# Patient Record
Sex: Male | Born: 1935 | Race: White | Hispanic: No | Marital: Married | Smoking: Never smoker
Health system: Southern US, Community
[De-identification: ages and names within clinical notes are randomized; demographics above are authoritative.]

## PROBLEM LIST (undated history)

## (undated) DIAGNOSIS — E7800 Pure hypercholesterolemia, unspecified: Secondary | ICD-10-CM

## (undated) DIAGNOSIS — R001 Bradycardia, unspecified: Secondary | ICD-10-CM

## (undated) DIAGNOSIS — H9190 Unspecified hearing loss, unspecified ear: Secondary | ICD-10-CM

## (undated) DIAGNOSIS — R2681 Unsteadiness on feet: Secondary | ICD-10-CM

## (undated) DIAGNOSIS — G2581 Restless legs syndrome: Secondary | ICD-10-CM

## (undated) DIAGNOSIS — Z6826 Body mass index (BMI) 26.0-26.9, adult: Secondary | ICD-10-CM

## (undated) DIAGNOSIS — M7989 Other specified soft tissue disorders: Secondary | ICD-10-CM

## (undated) DIAGNOSIS — I10 Essential (primary) hypertension: Secondary | ICD-10-CM

## (undated) HISTORY — DX: Pure hypercholesterolemia, unspecified: E78.00

## (undated) HISTORY — DX: Unspecified hearing loss, unspecified ear: H91.90

## (undated) HISTORY — DX: Bradycardia, unspecified: R00.1

## (undated) HISTORY — DX: Essential (primary) hypertension: I10

## (undated) HISTORY — PX: PACEMAKER IMPLANT: EP1218

## (undated) HISTORY — DX: Restless legs syndrome: G25.81

## (undated) HISTORY — DX: Body mass index (BMI) 26.0-26.9, adult: Z68.26

## (undated) HISTORY — DX: Unsteadiness on feet: R26.81

## (undated) HISTORY — DX: Other specified soft tissue disorders: M79.89

---

## 1998-09-03 HISTORY — PX: TOTAL HIP ARTHROPLASTY: SHX124

## 2019-04-20 ENCOUNTER — Other Ambulatory Visit: Payer: Self-pay

## 2019-04-20 ENCOUNTER — Telehealth (INDEPENDENT_AMBULATORY_CARE_PROVIDER_SITE_OTHER): Payer: Medicare Other | Admitting: Internal Medicine

## 2019-04-20 ENCOUNTER — Telehealth: Payer: Self-pay

## 2019-04-20 ENCOUNTER — Encounter: Payer: Self-pay | Admitting: Internal Medicine

## 2019-04-20 VITALS — BP 159/68 | HR 88

## 2019-04-20 DIAGNOSIS — I10 Essential (primary) hypertension: Secondary | ICD-10-CM | POA: Diagnosis not present

## 2019-04-20 DIAGNOSIS — R001 Bradycardia, unspecified: Secondary | ICD-10-CM | POA: Diagnosis not present

## 2019-04-20 NOTE — Telephone Encounter (Signed)
Spoke with pt regarding appt on 04/20/19. Pt stated he would rather do a phonecall. Pt was advise to check vitals prior to appt. Pt questions were address.

## 2019-04-20 NOTE — Telephone Encounter (Signed)
I spoke with Lattie Haw from [place] and she is going to fax me a copy of his Medtronic card. Once I receive the copy of the Medtronic card I can get the pt serial number for his device. I need that serial number to get the pt transferred into our clinic and order him a transmitter.

## 2019-04-20 NOTE — Progress Notes (Signed)
Electrophysiology TeleHealth Note   Due to national recommendations of social distancing due to [place] 19, an audio telehealth visit is felt to be most appropriate for this patient at this time.  Verbal consent was obtained by me for the telehealth visit today.  The patient does not have capability for a virtual visit.  A phone visit is therefore required today.   Date:  04/20/2019   ID:  Tom Chavez, DOB 08-27-1919, MRN [id number]  Location: patient's independent living facility  Provider location:  [facility]  Evaluation Performed: Follow-up visit  PCP:  Monico Blitz, MD   Electrophysiologist:  Dr Rayann Heman  Chief Complaint:  bradycardia  History of Present Illness:    Tom Chavez is a 85 y.o. male who presents via telehealth conferencing today.  This is a new patient visit.  He is referred by Dr Manuella Ghazi for pacemaker management.  He had PPM implanted for symptomatic bradycardia in [place] by Dr Violet Baldy in 2014.  He has done very well since.  He remains active for his age and is in an independent living facility. Today, he denies symptoms of palpitations, chest pain, shortness of breath,  lower extremity edema, dizziness, presyncope, or syncope.  The patient is otherwise without complaint today.    Past Medical History:  Diagnosis Date  . Body mass index 26.0-26.9, adult   . Decreased hearing   . Hypercholesterolemia   . Hypertension   . Restless leg   . Swelling of both lower extremities   . Symptomatic bradycardia    implanted at [facility])  . Unsteady gait     Past Surgical History:  Procedure Laterality Date  . PACEMAKER IMPLANT     Medtronic PPM implanted by Dr Violet Baldy at [facility], [place]  . TOTAL HIP ARTHROPLASTY Right 2000    Current Outpatient Medications  Medication Sig Dispense Refill  . AMLODIPINE BESYLATE PO Take 10 mg by mouth daily.    Marland Kitchen atorvastatin (LIPITOR) 20 MG tablet Take 20 mg by mouth daily.    Marland Kitchen rOPINIRole (REQUIP) 0.25 MG  tablet Take 0.25 mg by mouth at bedtime.    . valsartan-hydrochlorothiazide (DIOVAN-HCT) 160-25 MG tablet Take 1 tablet by mouth daily.     No current facility-administered medications for this visit.     Allergies:   Patient has no known allergies.   Social History:  The patient  reports that he has never smoked. He has never used smokeless tobacco. He reports that he does not drink alcohol or use drugs.   Family History: HTN  ROS:  Please see the history of present illness.   All other systems are personally reviewed and negative.    Exam:    Vital Signs:  BP (!) 159/68   Pulse 88   Well sounding   Labs/Other Tests and Data Reviewed:    Recent Labs: No results found for requested labs within last 8760 hours.   Wt Readings from Last 3 Encounters:  No data found for Wt      ASSESSMENT & PLAN:    1.  Symptomatic bradycardia I suspect complete heart block by history.  No cardiology office notes for review.  He does not typical follow remotely.  Device implanted 2014. I have sent a message to [place] to arrange for a remote monitoring (MDT) transmitter to be sent to his independent living facility).  The director of the facility has agreed to assist him with remotes. Follow-up in office in 3 months  if COVID 19 allows  2. HTN Stable No change required today   Follow-up:  As above   Patient Risk:  after full review of this patients clinical status, I feel that they are at moderate risk at this time.  Today, I have spent 15 minutes with the patient with telehealth technology discussing arrhythmia management .    Randolm IdolSigned, Iyah Laguna, MD  04/20/2019 12:02 PM     [facility] [address] [place] [address] [phone] (office) [phone] (fax)

## 2019-04-21 NOTE — Telephone Encounter (Signed)
I spoke with Judeen Hammans from [place] and let her know that the pt monitor is ordered. I let her know I took care of putting the pt information in our system. I told her if she needs help when the pt monitor comes to let me know. I gave her my direct office number.

## 2019-05-13 NOTE — Telephone Encounter (Signed)
Follow up     Judeen Hammans is calling with assistance on sitting up the monitor and how to do the transmission.

## 2019-05-14 NOTE — Telephone Encounter (Signed)
Called back to [place] at [facility] and she has left for the day and was asked to call her back 05/15/19 after 10 am.

## 2019-05-19 NOTE — Telephone Encounter (Signed)
I left a message with Sherri receptionist for her to give me a call back.

## 2019-05-21 ENCOUNTER — Telehealth: Payer: Self-pay

## 2019-05-21 NOTE — Telephone Encounter (Signed)
I spoke with Judeen Hammans from [place] and she agreed to send a transmission with the pt monitor on Monday.

## 2019-05-26 ENCOUNTER — Ambulatory Visit (INDEPENDENT_AMBULATORY_CARE_PROVIDER_SITE_OTHER): Payer: Medicare Other | Admitting: *Deleted

## 2019-05-26 DIAGNOSIS — R001 Bradycardia, unspecified: Secondary | ICD-10-CM | POA: Diagnosis not present

## 2019-05-27 LAB — CUP PACEART REMOTE DEVICE CHECK
Battery Impedance: 1210 Ohm
Battery Remaining Longevity: 49 mo
Battery Voltage: 2.78 V
Brady Statistic AP VP Percent: 12 %
Brady Statistic AP VS Percent: 0 %
Brady Statistic AS VP Percent: 88 %
Brady Statistic AS VS Percent: 0 %
Date Time Interrogation Session: 20200922150659
Implantable Lead Implant Date: 20140821
Implantable Lead Implant Date: 20140821
Implantable Lead Location: 753862
Implantable Lead Location: 753862
Implantable Lead Model: 5076
Implantable Lead Model: 5076
Implantable Pulse Generator Implant Date: 20140821
Lead Channel Impedance Value: 394 Ohm
Lead Channel Impedance Value: 418 Ohm
Lead Channel Pacing Threshold Amplitude: 0.75 V
Lead Channel Pacing Threshold Amplitude: 0.875 V
Lead Channel Pacing Threshold Pulse Width: 0.4 ms
Lead Channel Pacing Threshold Pulse Width: 0.4 ms
Lead Channel Setting Pacing Amplitude: 1.5 V
Lead Channel Setting Pacing Amplitude: 2 V
Lead Channel Setting Pacing Pulse Width: 0.4 ms
Lead Channel Setting Sensing Sensitivity: 2 mV

## 2019-06-08 NOTE — Progress Notes (Signed)
Remote pacemaker transmission.   

## 2019-07-29 ENCOUNTER — Telehealth (INDEPENDENT_AMBULATORY_CARE_PROVIDER_SITE_OTHER): Payer: Medicare Other | Admitting: Internal Medicine

## 2019-07-29 ENCOUNTER — Encounter: Payer: Self-pay | Admitting: Internal Medicine

## 2019-07-29 VITALS — BP 163/73 | HR 101 | Temp 98.6°F

## 2019-07-29 DIAGNOSIS — R001 Bradycardia, unspecified: Secondary | ICD-10-CM

## 2019-07-29 DIAGNOSIS — I10 Essential (primary) hypertension: Secondary | ICD-10-CM

## 2019-07-29 NOTE — Progress Notes (Signed)
Electrophysiology TeleHealth Note   Due to national recommendations of social distancing due to [place] 19, an audio telehealth visit is felt to be most appropriate for this patient at this time.  Verbal consent was obtained by me for the telehealth visit today.  The patient does not have capability for a virtual visit.  A phone visit is therefore required today.   Date:  07/29/2019   ID:  Tom Chavez, DOB [birth year], MRN [id number]  Location: patient's home  Provider location:  [facility]  Evaluation Performed: Follow-up visit  PCP:  Monico Blitz, MD   Electrophysiologist:  Dr Rayann Heman  Chief Complaint:  Pacemaker follow up  History of Present Illness:    Tom Chavez is a 85 y.o. male who presents via telehealth conferencing today.  Since last being seen in our clinic, the patient reports doing very well.  Today, he denies symptoms of palpitations, chest pain, shortness of breath,  lower extremity edema, dizziness, presyncope, or syncope.  The patient is otherwise without complaint today.  The patient denies symptoms of fevers, chills, cough, or new SOB worrisome for COVID 19.  Past Medical History:  Diagnosis Date  . Body mass index 26.0-26.9, adult   . Decreased hearing   . Hypercholesterolemia   . Hypertension   . Restless leg   . Swelling of both lower extremities   . Symptomatic bradycardia    implanted at [facility])  . Unsteady gait     Past Surgical History:  Procedure Laterality Date  . PACEMAKER IMPLANT     Medtronic PPM implanted by Dr Hinton Dyer at [facility], [place]  . TOTAL HIP ARTHROPLASTY Right 2000    Current Outpatient Medications  Medication Sig Dispense Refill  . AMLODIPINE BESYLATE PO Take 10 mg by mouth daily.    Marland Kitchen atorvastatin (LIPITOR) 20 MG tablet Take 20 mg by mouth daily.    Marland Kitchen rOPINIRole (REQUIP) 0.25 MG tablet Take 0.25 mg by mouth at bedtime.    . valsartan-hydrochlorothiazide (DIOVAN-HCT) 160-25 MG tablet Take 1  tablet by mouth daily.     No current facility-administered medications for this visit.     Allergies:   Patient has no known allergies.   Social History:  The patient  reports that he has never smoked. He has never used smokeless tobacco. He reports that he does not drink alcohol or use drugs.   Family History:  The patient's has no premature CAD   ROS:  Please see the history of present illness.   All other systems are personally reviewed and negative.    Exam:    Vital Signs:  BP (!) 163/73   Pulse (!) 101   Temp 98.6 F (37 C)   Well sounding, alert and conversant, regular work of breathing  Labs/Other Tests and Data Reviewed:    Recent Labs: No results found for requested labs within last 8760 hours.   Wt Readings from Last 3 Encounters:  No data found for Wt     Last device remote is reviewed from [place] PDF which reveals normal device function   ASSESSMENT & PLAN:    1.  Symptomatic bradycardia Normal device function by recent remote See Pace[employer] report Continue [facility] transmissions  2.  HTN Elevated today but due to "hurrying to get to the visit" Normally controlled   Follow-up:  [facility], me in 1 year in [place]    Patient Risk:  after full review of this patients clinical status, I feel that they  are at moderate risk at this time.  Today, I have spent 15 minutes with the patient with telehealth technology discussing arrhythmia management .    Randolm Idol, MD  07/29/2019 11:57 AM     [facility] [address] [place] [address] [phone] (office) [phone] (fax)

## 2019-08-16 ENCOUNTER — Emergency Department (HOSPITAL_COMMUNITY): Payer: Medicare Other

## 2019-08-16 ENCOUNTER — Other Ambulatory Visit: Payer: Self-pay

## 2019-08-16 ENCOUNTER — Inpatient Hospital Stay (HOSPITAL_COMMUNITY)
Admission: EM | Admit: 2019-08-16 | Discharge: 2019-08-22 | DRG: 177 | Disposition: A | Discharge status: Skilled Nursing Facility | Payer: Medicare Other | Attending: Internal Medicine | Admitting: Internal Medicine

## 2019-08-16 ENCOUNTER — Encounter (HOSPITAL_COMMUNITY): Payer: Self-pay | Admitting: Emergency Medicine

## 2019-08-16 DIAGNOSIS — E861 Hypovolemia: Secondary | ICD-10-CM | POA: Diagnosis present

## 2019-08-16 DIAGNOSIS — R609 Edema, unspecified: Secondary | ICD-10-CM | POA: Diagnosis not present

## 2019-08-16 DIAGNOSIS — I447 Left bundle-branch block, unspecified: Secondary | ICD-10-CM | POA: Diagnosis present

## 2019-08-16 DIAGNOSIS — E7800 Pure hypercholesterolemia, unspecified: Secondary | ICD-10-CM | POA: Diagnosis present

## 2019-08-16 DIAGNOSIS — R0602 Shortness of breath: Secondary | ICD-10-CM

## 2019-08-16 DIAGNOSIS — E871 Hypo-osmolality and hyponatremia: Secondary | ICD-10-CM | POA: Diagnosis present

## 2019-08-16 DIAGNOSIS — N179 Acute kidney failure, unspecified: Secondary | ICD-10-CM | POA: Diagnosis present

## 2019-08-16 DIAGNOSIS — E785 Hyperlipidemia, unspecified: Secondary | ICD-10-CM | POA: Diagnosis present

## 2019-08-16 DIAGNOSIS — N1832 Chronic kidney disease, stage 3b: Secondary | ICD-10-CM | POA: Diagnosis present

## 2019-08-16 DIAGNOSIS — N183 Chronic kidney disease, stage 3 unspecified: Secondary | ICD-10-CM

## 2019-08-16 DIAGNOSIS — I129 Hypertensive chronic kidney disease with stage 1 through stage 4 chronic kidney disease, or unspecified chronic kidney disease: Secondary | ICD-10-CM | POA: Diagnosis present

## 2019-08-16 DIAGNOSIS — I10 Essential (primary) hypertension: Secondary | ICD-10-CM

## 2019-08-16 DIAGNOSIS — J1289 Other viral pneumonia: Secondary | ICD-10-CM | POA: Diagnosis present

## 2019-08-16 DIAGNOSIS — J9601 Acute respiratory failure with hypoxia: Secondary | ICD-10-CM | POA: Diagnosis present

## 2019-08-16 DIAGNOSIS — M79605 Pain in left leg: Secondary | ICD-10-CM | POA: Diagnosis not present

## 2019-08-16 DIAGNOSIS — U071 COVID-19: Principal | ICD-10-CM | POA: Diagnosis present

## 2019-08-16 DIAGNOSIS — Z96641 Presence of right artificial hip joint: Secondary | ICD-10-CM | POA: Diagnosis present

## 2019-08-16 DIAGNOSIS — E860 Dehydration: Secondary | ICD-10-CM | POA: Diagnosis present

## 2019-08-16 DIAGNOSIS — R0902 Hypoxemia: Secondary | ICD-10-CM

## 2019-08-16 DIAGNOSIS — J1282 Pneumonia due to coronavirus disease 2019: Secondary | ICD-10-CM | POA: Diagnosis present

## 2019-08-16 DIAGNOSIS — Z79899 Other long term (current) drug therapy: Secondary | ICD-10-CM

## 2019-08-16 DIAGNOSIS — M79604 Pain in right leg: Secondary | ICD-10-CM | POA: Diagnosis not present

## 2019-08-16 DIAGNOSIS — D631 Anemia in chronic kidney disease: Secondary | ICD-10-CM | POA: Diagnosis present

## 2019-08-16 DIAGNOSIS — I159 Secondary hypertension, unspecified: Secondary | ICD-10-CM | POA: Diagnosis not present

## 2019-08-16 DIAGNOSIS — G2581 Restless legs syndrome: Secondary | ICD-10-CM | POA: Diagnosis present

## 2019-08-16 DIAGNOSIS — H9190 Unspecified hearing loss, unspecified ear: Secondary | ICD-10-CM | POA: Diagnosis present

## 2019-08-16 LAB — CBC WITH DIFFERENTIAL/PLATELET
Abs Immature Granulocytes: 0.01 10*3/uL (ref 0.00–0.07)
Basophils Absolute: 0 10*3/uL (ref 0.0–0.1)
Basophils Relative: 0 %
Eosinophils Absolute: 0 10*3/uL (ref 0.0–0.5)
Eosinophils Relative: 0 %
HCT: 35.3 % — ABNORMAL LOW (ref 39.0–52.0)
Hemoglobin: 11.2 g/dL — ABNORMAL LOW (ref 13.0–17.0)
Immature Granulocytes: 0 %
Lymphocytes Relative: 9 %
Lymphs Abs: 0.5 10*3/uL — ABNORMAL LOW (ref 0.7–4.0)
MCH: 28.9 pg (ref 26.0–34.0)
MCHC: 31.7 g/dL (ref 30.0–36.0)
MCV: 91.2 fL (ref 80.0–100.0)
Monocytes Absolute: 0.7 10*3/uL (ref 0.1–1.0)
Monocytes Relative: 15 %
Neutro Abs: 3.6 10*3/uL (ref 1.7–7.7)
Neutrophils Relative %: 76 %
Platelets: 173 10*3/uL (ref 150–400)
RBC: 3.87 MIL/uL — ABNORMAL LOW (ref 4.22–5.81)
RDW: 14.6 % (ref 11.5–15.5)
WBC: 4.8 10*3/uL (ref 4.0–10.5)
nRBC: 0 % (ref 0.0–0.2)

## 2019-08-16 LAB — COMPREHENSIVE METABOLIC PANEL
ALT: 18 U/L (ref 0–44)
AST: 22 U/L (ref 15–41)
Albumin: 3.9 g/dL (ref 3.5–5.0)
Alkaline Phosphatase: 84 U/L (ref 38–126)
Anion gap: 13 (ref 5–15)
BUN: 28 mg/dL — ABNORMAL HIGH (ref 8–23)
CO2: 21 mmol/L — ABNORMAL LOW (ref 22–32)
Calcium: 9 mg/dL (ref 8.9–10.3)
Chloride: 94 mmol/L — ABNORMAL LOW (ref 98–111)
Creatinine, Ser: 1.39 mg/dL — ABNORMAL HIGH (ref 0.61–1.24)
GFR calc Af Amer: 49 mL/min — ABNORMAL LOW (ref >60)
GFR calc non Af Amer: 42 mL/min — ABNORMAL LOW (ref >60)
Glucose, Bld: 103 mg/dL — ABNORMAL HIGH (ref 70–99)
Potassium: 4 mmol/L (ref 3.5–5.1)
Sodium: 128 mmol/L — ABNORMAL LOW (ref 135–145)
Total Bilirubin: 0.7 mg/dL (ref 0.3–1.2)
Total Protein: 6.7 g/dL (ref 6.5–8.1)

## 2019-08-16 LAB — TRIGLYCERIDES: Triglycerides: 57 mg/dL (ref <150)

## 2019-08-16 LAB — POC SARS CORONAVIRUS 2 AG -  ED: SARS Coronavirus 2 Ag: POSITIVE — AB

## 2019-08-16 LAB — LACTATE DEHYDROGENASE: LDH: 207 U/L — ABNORMAL HIGH (ref 98–192)

## 2019-08-16 LAB — PROCALCITONIN: Procalcitonin: <0.1 ng/mL

## 2019-08-16 LAB — FIBRINOGEN: Fibrinogen: 413 mg/dL (ref 210–475)

## 2019-08-16 LAB — C-REACTIVE PROTEIN: CRP: 5.1 mg/dL — ABNORMAL HIGH (ref <1.0)

## 2019-08-16 LAB — FERRITIN: Ferritin: 349 ng/mL — ABNORMAL HIGH (ref 24–336)

## 2019-08-16 LAB — D-DIMER, QUANTITATIVE: D-Dimer, Quant: 1.9 ug/mL-FEU — ABNORMAL HIGH (ref 0.00–0.50)

## 2019-08-16 LAB — LACTIC ACID, PLASMA
Lactic Acid, Venous: 1.3 mmol/L (ref 0.5–1.9)
Lactic Acid, Venous: 1.1 mmol/L (ref 0.5–1.9)

## 2019-08-16 MED ORDER — ACETAMINOPHEN 500 MG PO TABS
1000.0000 mg | ORAL_TABLET | Freq: Once | ORAL | Status: AC
  Administered 2019-08-16: 1000 mg via ORAL
  Filled 2019-08-16: qty 2

## 2019-08-16 NOTE — ED Provider Notes (Signed)
Emergency Department Provider Note   I have reviewed the triage vital signs and the nursing notes.   HISTORY  Chief Complaint Shortness of Breath   HPI Stpehen Chavez is a 85 y.o. male with past medical history reviewed below presents to the ED from [facility] with cough, congestion, sore throat, runny nose.  Staff report noticing some shortness of breath but patient denies feeling subjectively short of breath.  He denies chest or abdominal pain.  He did have 2 episodes of diarrhea today but denies vomiting.  No radiation of symptoms or other modifying factors. No known COVID contacts.  Past Medical History:  Diagnosis Date  . Body mass index 26.0-26.9, adult   . Decreased hearing   . Hypercholesterolemia   . Hypertension   . Restless leg   . Swelling of both lower extremities   . Symptomatic bradycardia    implanted at [facility])  . Unsteady gait     There are no problems to display for this patient.   Past Surgical History:  Procedure Laterality Date  . PACEMAKER IMPLANT     Medtronic PPM implanted by Dr Judd Gaudier at [facility], [place]  . TOTAL HIP ARTHROPLASTY Right 2000    Allergies Patient has no known allergies.  History reviewed. No pertinent family history.  Social History Social History   Tobacco Use  . Smoking status: Never Smoker  . Smokeless tobacco: Never Used  Substance Use Topics  . Alcohol use: Never  . Drug use: Never    Review of Systems  Constitutional: Positive fever/chills Eyes: No visual changes. ENT: Positive sore throat and nasal congestion.  Cardiovascular: Denies chest pain. Respiratory: Denies shortness of breath positive cough.  Gastrointestinal: No abdominal pain.  No nausea, no vomiting.  No diarrhea.  No constipation. Genitourinary: Negative for dysuria. Musculoskeletal: Negative for back pain. Skin: Negative for rash. Neurological: Negative for headaches, focal weakness or  numbness.  10-point ROS otherwise negative.  ____________________________________________   PHYSICAL EXAM:  VITAL SIGNS: ED Triage Vitals  Enc Vitals Group     BP 08/16/19 1720 (!) 141/58     Pulse Rate 08/16/19 1720 88     Resp 08/16/19 1720 (!) 33     Temp 08/16/19 1720 98.9 F (37.2 C)     Temp Source 08/16/19 1720 Oral     SpO2 08/16/19 1720 99 %     Weight 08/16/19 1722 145 lb (65.8 kg)     Height 08/16/19 1722 5\' 7"  (1.702 m)   Constitutional: Alert and oriented. Well appearing and in no acute distress. Eyes: Conjunctivae are normal. Head: Atraumatic. Nose: No congestion/rhinnorhea. Mouth/Throat: Mucous membranes are moist.   Neck: No stridor.   Cardiovascular: Normal rate, regular rhythm. Good peripheral circulation. Grossly normal heart sounds.   Respiratory: Increased respiratory effort.  No retractions. Lungs with crackles at the bases bilaterally.  Gastrointestinal: Soft and nontender. No distention.  Musculoskeletal: No gross deformities of extremities. Neurologic:  Normal speech and language Skin:  Skin is warm, dry and intact. No rash noted.   ____________________________________________   LABS (all labs ordered are listed, but only abnormal results are displayed)  Labs Reviewed  CBC WITH DIFFERENTIAL/PLATELET - Abnormal; Notable for the following components:      Result Value   RBC 3.87 (*)    Hemoglobin 11.2 (*)    HCT 35.3 (*)    Lymphs Abs 0.5 (*)    All other components within normal limits  COMPREHENSIVE METABOLIC PANEL -  Abnormal; Notable for the following components:   Sodium 128 (*)    Chloride 94 (*)    CO2 21 (*)    Glucose, Bld 103 (*)    BUN 28 (*)    Creatinine, Ser 1.39 (*)    GFR calc non Af Amer 42 (*)    GFR calc Af Amer 49 (*)    All other components within normal limits  D-DIMER, QUANTITATIVE (NOT AT [facility]) - Abnormal; Notable for the following components:   D-Dimer, Quant 1.90 (*)    All other components within normal  limits  LACTATE DEHYDROGENASE - Abnormal; Notable for the following components:   LDH 207 (*)    All other components within normal limits  FERRITIN - Abnormal; Notable for the following components:   Ferritin 349 (*)    All other components within normal limits  C-REACTIVE PROTEIN - Abnormal; Notable for the following components:   CRP 5.1 (*)    All other components within normal limits  POC SARS CORONAVIRUS 2 AG -  ED - Abnormal; Notable for the following components:   SARS Coronavirus 2 Ag POSITIVE (*)    All other components within normal limits  CULTURE, BLOOD (ROUTINE X 2)  CULTURE, BLOOD (ROUTINE X 2)  LACTIC ACID, PLASMA  LACTIC ACID, PLASMA  PROCALCITONIN  TRIGLYCERIDES  FIBRINOGEN   ____________________________________________  EKG   EKG Interpretation  Date/Time:  Sunday August 16 2019 17:16:39 EST Ventricular Rate:  84 PR Interval:    QRS Duration: 167 QT Interval:  444 QTC Calculation: 525 R Axis:   -44 Text Interpretation: Sinus rhythm Left bundle branch block Confirmed by Nanda Quinton [phone] on 08/16/2019 5:58:48 PM       ____________________________________________  RADIOLOGY  DG Chest Port 1 View  Result Date: 08/16/2019 CLINICAL DATA:  Shortness of breath for 2 days, COVID-19 positivity. EXAM: PORTABLE CHEST 1 VIEW COMPARISON:  None. FINDINGS: Cardiac shadow is enlarged. Pacing device is noted. Lungs are well aerated bilaterally. Patchy parenchymal opacities are noted bilaterally consistent with atypical pneumonia. No sizable effusion is seen. No bony abnormality is noted. IMPRESSION: Patchy opacities bilaterally consistent with atypical/viral pneumonia. This is consistent with the patient's given clinical history of COVID-19 positivity. Electronically Signed   By: Inez Catalina M.D.   On: 08/16/2019 18:01    ____________________________________________   PROCEDURES  Procedure(s) performed:   Procedures  CRITICAL CARE Performed by:  Margette Fast Total critical care time: 35 minutes Critical care time was exclusive of separately billable procedures and treating other patients. Critical care was necessary to treat or prevent imminent or life-threatening deterioration. Critical care was time spent personally by me on the following activities: development of treatment plan with patient and/or surrogate as well as nursing, discussions with consultants, evaluation of patient's response to treatment, examination of patient, obtaining history from patient or surrogate, ordering and performing treatments and interventions, ordering and review of laboratory studies, ordering and review of radiographic studies, pulse oximetry and re-evaluation of patient's condition.  Nanda Quinton, MD Emergency Medicine  ____________________________________________   INITIAL IMPRESSION / ASSESSMENT AND PLAN / ED COURSE  Pertinent labs & imaging results that were available during my care of the patient were reviewed by me and considered in my medical decision making (see chart for details).   Patient presents to the emergency department for evaluation of COVID-19-like illness.  His rapid test here in the emergency department was come back positive.  I called and spoke with his daughter who is  listed as the emergency contact.  She states that his wife actually would be more appropriate to discuss CODE STATUS and provided her phone number.  Will obtain screening lab work.  Patient does have tachypnea here but appears relatively comfortable.  Not requiring oxygen at rest but will ambulate with a walker which is his baseline.   Labs reviewed. CXR with infiltrate. Patient ambulated but had increased in WOB with RR > 40 and O2 sats of 87% on RA with standing only. Returns to normal sats if laying.   Discussed patient's case with [facility] to request admission. Patient and family (if present) updated with plan. [facility]  I reviewed all  nursing notes, vitals, pertinent old records, EKGs, labs, imaging (as available).  ____________________________________________  FINAL CLINICAL IMPRESSION(S) / ED DIAGNOSES  Final diagnoses:  Hypoxia  SOB (shortness of breath)  COVID-19    MEDICATIONS GIVEN DURING THIS VISIT:  Medications  acetaminophen (TYLENOL) tablet 1,000 mg (1,000 mg Oral Given 08/16/19 1831)    Note:  This document was prepared using Dragon voice recognition software and may include unintentional dictation errors.  Alona BeneJoshua Kris Burd, MD, [facility] Emergency Medicine    Liese Dizdarevic, Arlyss RepressJoshua G, MD 08/16/19 2201

## 2019-08-16 NOTE — ED Notes (Signed)
When pt stood on the side of the bed to urinate his O2 sats dropped to 87%. Therefore, I did not ambulate pt with risk of even more hypoxia. Pt was very SOB and had labored breathings, respirations increased to 44 bpm while standing at bedside even without ambulating. Pt placed back in bed with O2 sats increasing to 98% at rest.

## 2019-08-16 NOTE — ED Triage Notes (Signed)
Pt brought in from [facility] for cough, congestion, sore throat, and runny nose along with sob since Friday.

## 2019-08-17 DIAGNOSIS — J1289 Other viral pneumonia: Secondary | ICD-10-CM

## 2019-08-17 DIAGNOSIS — U071 COVID-19: Principal | ICD-10-CM

## 2019-08-17 LAB — CBC WITH DIFFERENTIAL/PLATELET
Abs Immature Granulocytes: 0.02 10*3/uL (ref 0.00–0.07)
Basophils Absolute: 0 10*3/uL (ref 0.0–0.1)
Basophils Relative: 0 %
Eosinophils Absolute: 0 10*3/uL (ref 0.0–0.5)
Eosinophils Relative: 0 %
HCT: 36.4 % — ABNORMAL LOW (ref 39.0–52.0)
Hemoglobin: 11.5 g/dL — ABNORMAL LOW (ref 13.0–17.0)
Immature Granulocytes: 0 %
Lymphocytes Relative: 4 %
Lymphs Abs: 0.3 10*3/uL — ABNORMAL LOW (ref 0.7–4.0)
MCH: 29 pg (ref 26.0–34.0)
MCHC: 31.6 g/dL (ref 30.0–36.0)
MCV: 91.7 fL (ref 80.0–100.0)
Monocytes Absolute: 0.6 10*3/uL (ref 0.1–1.0)
Monocytes Relative: 9 %
Neutro Abs: 5.3 10*3/uL (ref 1.7–7.7)
Neutrophils Relative %: 87 %
Platelets: 171 10*3/uL (ref 150–400)
RBC: 3.97 MIL/uL — ABNORMAL LOW (ref 4.22–5.81)
RDW: 14.5 % (ref 11.5–15.5)
WBC: 6.1 10*3/uL (ref 4.0–10.5)
nRBC: 0 % (ref 0.0–0.2)

## 2019-08-17 LAB — COMPREHENSIVE METABOLIC PANEL
ALT: 20 U/L (ref 0–44)
AST: 26 U/L (ref 15–41)
Albumin: 3.7 g/dL (ref 3.5–5.0)
Alkaline Phosphatase: 74 U/L (ref 38–126)
Anion gap: 14 (ref 5–15)
BUN: 31 mg/dL — ABNORMAL HIGH (ref 8–23)
CO2: 18 mmol/L — ABNORMAL LOW (ref 22–32)
Calcium: 8.8 mg/dL — ABNORMAL LOW (ref 8.9–10.3)
Chloride: 99 mmol/L (ref 98–111)
Creatinine, Ser: 1.49 mg/dL — ABNORMAL HIGH (ref 0.61–1.24)
GFR calc Af Amer: 45 mL/min — ABNORMAL LOW (ref >60)
GFR calc non Af Amer: 39 mL/min — ABNORMAL LOW (ref >60)
Glucose, Bld: 112 mg/dL — ABNORMAL HIGH (ref 70–99)
Potassium: 4.1 mmol/L (ref 3.5–5.1)
Sodium: 131 mmol/L — ABNORMAL LOW (ref 135–145)
Total Bilirubin: 0.9 mg/dL (ref 0.3–1.2)
Total Protein: 6.5 g/dL (ref 6.5–8.1)

## 2019-08-17 LAB — ABO/RH: ABO/RH(D): A POS

## 2019-08-17 LAB — D-DIMER, QUANTITATIVE: D-Dimer, Quant: 1.97 ug/mL-FEU — ABNORMAL HIGH (ref 0.00–0.50)

## 2019-08-17 LAB — C-REACTIVE PROTEIN: CRP: 8.5 mg/dL — ABNORMAL HIGH (ref <1.0)

## 2019-08-17 LAB — FERRITIN: Ferritin: 427 ng/mL — ABNORMAL HIGH (ref 24–336)

## 2019-08-17 MED ORDER — IRBESARTAN 150 MG PO TABS
150.0000 mg | ORAL_TABLET | Freq: Every day | ORAL | Status: DC
  Administered 2019-08-17 – 2019-08-22 (×5): 150 mg via ORAL
  Filled 2019-08-17 (×8): qty 1

## 2019-08-17 MED ORDER — SODIUM CHLORIDE 0.9 % IV SOLN
200.0000 mg | Freq: Once | INTRAVENOUS | Status: DC
  Administered 2019-08-17: 200 mg via INTRAVENOUS
  Filled 2019-08-17: qty 40

## 2019-08-17 MED ORDER — AMLODIPINE BESYLATE 10 MG PO TABS
10.0000 mg | ORAL_TABLET | Freq: Every day | ORAL | Status: DC
  Administered 2019-08-17 – 2019-08-22 (×6): 10 mg via ORAL
  Filled 2019-08-17: qty 1
  Filled 2019-08-17: qty 2
  Filled 2019-08-17 (×4): qty 1

## 2019-08-17 MED ORDER — ROPINIROLE HCL 0.25 MG PO TABS
0.2500 mg | ORAL_TABLET | Freq: Every day | ORAL | Status: DC
  Administered 2019-08-18 – 2019-08-21 (×4): 0.25 mg via ORAL
  Filled 2019-08-17 (×8): qty 1

## 2019-08-17 MED ORDER — SODIUM CHLORIDE 0.9% FLUSH
3.0000 mL | Freq: Two times a day (BID) | INTRAVENOUS | Status: DC
  Administered 2019-08-17 – 2019-08-18 (×2): 3 mL via INTRAVENOUS
  Administered 2019-08-19: 10 mL via INTRAVENOUS
  Administered 2019-08-19 – 2019-08-22 (×7): 3 mL via INTRAVENOUS

## 2019-08-17 MED ORDER — ENOXAPARIN SODIUM 30 MG/0.3ML ~~LOC~~ SOLN
30.0000 mg | SUBCUTANEOUS | Status: DC
  Administered 2019-08-17 – 2019-08-18 (×2): 30 mg via SUBCUTANEOUS
  Filled 2019-08-17 (×2): qty 0.3

## 2019-08-17 MED ORDER — ATORVASTATIN CALCIUM 10 MG PO TABS
20.0000 mg | ORAL_TABLET | Freq: Every day | ORAL | Status: DC
  Administered 2019-08-17 – 2019-08-22 (×6): 20 mg via ORAL
  Filled 2019-08-17 (×6): qty 2

## 2019-08-17 MED ORDER — DEXAMETHASONE SODIUM PHOSPHATE 10 MG/ML IJ SOLN
6.0000 mg | INTRAMUSCULAR | Status: DC
  Administered 2019-08-17 – 2019-08-22 (×6): 6 mg via INTRAVENOUS
  Filled 2019-08-17 (×6): qty 1

## 2019-08-17 MED ORDER — SODIUM CHLORIDE 0.9 % IV SOLN
250.0000 mL | INTRAVENOUS | Status: DC | PRN
  Administered 2019-08-22: 250 mL via INTRAVENOUS

## 2019-08-17 MED ORDER — SODIUM CHLORIDE 0.9 % IV SOLN: 200.0000 mg | Freq: Once | INTRAVENOUS | Status: DC

## 2019-08-17 MED ORDER — SODIUM CHLORIDE 0.9 % IV SOLN
INTRAVENOUS | Status: AC
  Administered 2019-08-17: 04:00:00 via INTRAVENOUS

## 2019-08-17 MED ORDER — SODIUM CHLORIDE 0.9 % IV SOLN: 100.0000 mg | Freq: Every day | INTRAVENOUS | Status: DC

## 2019-08-17 MED ORDER — SODIUM CHLORIDE 0.9% FLUSH: 3.0000 mL | INTRAVENOUS | Status: DC | PRN

## 2019-08-17 MED ORDER — SODIUM CHLORIDE 0.9 % IV SOLN
100.0000 mg | Freq: Every day | INTRAVENOUS | Status: DC
  Administered 2019-08-18 – 2019-08-20 (×2): 100 mg via INTRAVENOUS
  Filled 2019-08-17 (×4): qty 20

## 2019-08-17 NOTE — Plan of Care (Signed)
  Problem: Education: Goal: Knowledge of risk factors and measures for prevention of condition will improve Outcome: Progressing   Problem: Coping: Goal: Psychosocial and spiritual needs will be supported Outcome: Progressing   Problem: Respiratory: Goal: Will maintain a patent airway Outcome: Progressing Goal: Complications related to the disease process, condition or treatment will be avoided or minimized Outcome: Progressing   

## 2019-08-17 NOTE — Progress Notes (Addendum)
Notified daughter of progress all questions answered and this nurse's number shared for further communication.  

## 2019-08-17 NOTE — Progress Notes (Signed)
Patient seen and examined. Admitted after midnight secondary to cough, SOB, runny nose, diarrhea and anorexia. Symptoms present for 3 days or so and worsening. CXR with bilateral patchy infiltrates, COVID-19 test positive. Stable oxygen saturation and VS. Patient will be admitted for steroids remdesivir and supportive care. Please refer to H&P written by Dr. Darrick Meigs for further info/details on admission.   Plan: -gentle IVf's for dehydration, AKI and hyponatremia -steroids and remdesivir as per COVID protocol  -supportive care and PRN bronchodilators  -follow clinical response.  Barton Dubois MD [phone]

## 2019-08-17 NOTE — H&P (Signed)
TRH H&P    Patient Demographics:    Tom Chavez, is a 85 y.o. male  MRN: [id number]  DOB - Feb 05, 1919  Admit Date - 08/16/2019  Referring MD/NP/PA: Nanda Quinton  Outpatient Primary MD for the patient is Monico Blitz, MD  Patient coming from: [facility]  Chief complaint-shortness of breath   HPI:    Tom Chavez  is a 85 y.o. male, with history of hypertension, hyperlipidemia, was brought to the ED from [facility] with chief complaints of cough, congestion sore throat, runny nose.  Patient has a hearing aid and history is limited.  History obtained from the ED records.  Patient denies shortness of breath.  Denies chest or abdominal pain.  He had 2 episodes of diarrhea but no vomiting.  There are no known Covid contacts. In the ED, patient's O2 sats are 98% on room air, mild tachypnea. Lab work showed positive COVID-19 antigen test LDH 207, ferritin 349, CRP 5.1, procalcitonin less than 0.10     Review of systems:    In addition to the HPI above,    All other systems reviewed and are negative.    Past History of the following :    Past Medical History:  Diagnosis Date  . Body mass index 26.0-26.9, adult   . Decreased hearing   . Hypercholesterolemia   . Hypertension   . Restless leg   . Swelling of both lower extremities   . Symptomatic bradycardia    implanted at [facility])  . Unsteady gait       Past Surgical History:  Procedure Laterality Date  . PACEMAKER IMPLANT     Medtronic PPM implanted by Dr Hinton Dyer at [facility] [place]  . TOTAL HIP ARTHROPLASTY Right 2000      Social History:      Social History   Tobacco Use  . Smoking status: Never Smoker  . Smokeless tobacco: Never Used  Substance Use Topics  . Alcohol use: Never       Family History :   Unable to obtain  family history from patient   Home  Medications:   Prior to Admission medications   Medication Sig Start Date End Date Taking? Authorizing Provider  amLODipine (NORVASC) 10 MG tablet Take 10 mg by mouth daily. 08/11/19  Yes [provider]  atorvastatin (LIPITOR) 20 MG tablet Take 20 mg by mouth daily.   Yes [provider]  rOPINIRole (REQUIP) 0.25 MG tablet Take 0.25 mg by mouth at bedtime.   Yes [provider]  SIMBRINZA 1-0.2 % SUSP Place 1 drop into both eyes 3 (three) times daily. 07/13/19  Yes [provider]  valsartan-hydrochlorothiazide (DIOVAN-HCT) 160-25 MG tablet Take 1 tablet by mouth daily.   Yes [provider]     Allergies:    No Known Allergies   Physical Exam:   Vitals  Blood pressure (!) 129/51, pulse 85, temperature 98.9 F (37.2 C), temperature source Oral, resp. rate (!) 22, height 5\' 7"  (1.702 m), weight 65.8 kg, SpO2 98 %.  1.  General: Appears in no acute distress  2. Psychiatric: Alert  3. Neurologic: Cranial nerves II through XII grossly intact, moving all extremities  4. HEENMT:  Atraumatic normocephalic, extraocular muscles are intact  5. Respiratory : Bilateral rhonchi auscultated  6. Cardiovascular : S1-S2, regular, no murmur auscultated  7. Gastrointestinal:  Abdomen soft, nontender, no organomegaly      Data Review:    CBC Recent Labs  Lab 08/16/19 1827  WBC 4.8  HGB 11.2*  HCT 35.3*  PLT 173  MCV 91.2  MCH 28.9  MCHC 31.7  RDW 14.6  LYMPHSABS 0.5*  MONOABS 0.7  EOSABS 0.0  BASOSABS 0.0   ------------------------------------------------------------------------------------------------------------------  Results for orders placed or performed during the hospital encounter of 08/16/19 (from the past 48 hour(s))  POC SARS Coronavirus 2 Ag-ED -     Status: Abnormal   Collection Time: 08/16/19  5:33 PM  Result Value Ref Range   SARS Coronavirus 2 Ag POSITIVE (A) NEGATIVE    Comment: (NOTE) SARS-CoV-2 antigen  PRESENT. Positive results indicate the presence of viral antigens, but clinical correlation with patient history and other diagnostic information is necessary to determine patient infection status.  Positive results do not rule out bacterial infection or co-infection  with other viruses. False positive results are rare but can occur, and confirmatory RT-PCR testing may be appropriate in some circumstances. The expected result is Negative. Fact Sheet for Patients: https://sanders-williams.net/ Fact Sheet for Providers: https://martinez.com/  This test is not yet approved or cleared by the [place] FDA and  has been authorized for detection and/or diagnosis of SARS-CoV-2 by FDA under an Emergency Use Authorization (EUA).  This EUA will remain in effect (meaning this test can be used) for the duration of  the COVID-19 declaration under Section 564(b)(1) of the Act, 21 U.S.C. section 360bbb-3(b)(1), unless the a uthorization is terminated or revoked sooner.   Lactic acid, plasma     Status: None   Collection Time: 08/16/19  6:27 PM  Result Value Ref Range   Lactic Acid, Venous 1.3 0.5 - 1.9 mmol/L    Comment: Performed at [facility], [address] [place], [place] [address]  CBC WITH DIFFERENTIAL     Status: Abnormal   Collection Time: 08/16/19  6:27 PM  Result Value Ref Range   WBC 4.8 4.0 - 10.5 K/uL   RBC 3.87 (L) 4.22 - 5.81 MIL/uL   Hemoglobin 11.2 (L) 13.0 - 17.0 g/dL   HCT 36.6 (L) 29.4 - 76.5 %   MCV 91.2 80.0 - 100.0 fL   MCH 28.9 26.0 - 34.0 pg   MCHC 31.7 30.0 - 36.0 g/dL   RDW 46.5 03.5 - 46.5 %   Platelets 173 150 - 400 K/uL   nRBC 0.0 0.0 - 0.2 %   Neutrophils Relative % 76 %   Neutro Abs 3.6 1.7 - 7.7 K/uL   Lymphocytes Relative 9 %   Lymphs Abs 0.5 (L) 0.7 - 4.0 K/uL   Monocytes Relative 15 %   Monocytes Absolute 0.7 0.1 - 1.0 K/uL   Eosinophils Relative 0 %   Eosinophils Absolute 0.0 0.0 - 0.5 K/uL   Basophils Relative 0  %   Basophils Absolute 0.0 0.0 - 0.1 K/uL   Immature Granulocytes 0 %   Abs Immature Granulocytes 0.01 0.00 - 0.07 K/uL    Comment: Performed at [facility], [address], [place], [place] [address]  Comprehensive metabolic panel     Status: Abnormal  Collection Time: 08/16/19  6:27 PM  Result Value Ref Range   Sodium 128 (L) 135 - 145 mmol/L   Potassium 4.0 3.5 - 5.1 mmol/L   Chloride 94 (L) 98 - 111 mmol/L   CO2 21 (L) 22 - 32 mmol/L   Glucose, Bld 103 (H) 70 - 99 mg/dL   BUN 28 (H) 8 - 23 mg/dL   Creatinine, Ser 1.61 (H) 0.61 - 1.24 mg/dL   Calcium 9.0 8.9 - 09.6 mg/dL   Total Protein 6.7 6.5 - 8.1 g/dL   Albumin 3.9 3.5 - 5.0 g/dL   AST 22 15 - 41 U/L   ALT 18 0 - 44 U/L   Alkaline Phosphatase 84 38 - 126 U/L   Total Bilirubin 0.7 0.3 - 1.2 mg/dL   GFR calc non Af Amer 42 (L) >60 mL/min   GFR calc Af Amer 49 (L) >60 mL/min   Anion gap 13 5 - 15    Comment: Performed at [facility], [address] [place], [place] [address]  D-dimer, quantitative     Status: Abnormal   Collection Time: 08/16/19  6:27 PM  Result Value Ref Range   D-Dimer, Quant 1.90 (H) 0.00 - 0.50 ug/mL-FEU    Comment: (NOTE) At the manufacturer cut-off of 0.50 ug/mL FEU, this assay has been documented to exclude PE with a sensitivity and negative predictive value of 97 to 99%.  At this time, this assay has not been approved by the FDA to exclude DVT/VTE. Results should be correlated with clinical presentation. Performed at [facility], [address] [place], [place] [address]   Procalcitonin     Status: None   Collection Time: 08/16/19  6:27 PM  Result Value Ref Range   Procalcitonin <0.10 ng/mL    Comment:        Interpretation: PCT (Procalcitonin) <= 0.5 ng/mL: Systemic infection (sepsis) is not likely. Local bacterial infection is possible. (NOTE)       Sepsis PCT Algorithm           Lower Respiratory Tract                                      Infection PCT Algorithm     ----------------------------     ----------------------------         PCT < 0.25 ng/mL                PCT < 0.10 ng/mL         Strongly encourage             Strongly discourage   discontinuation of antibiotics    initiation of antibiotics    ----------------------------     -----------------------------       PCT 0.25 - 0.50 ng/mL            PCT 0.10 - 0.25 ng/mL               OR       >80% decrease in PCT            Discourage initiation of                                            antibiotics      Encourage discontinuation  of antibiotics    ----------------------------     -----------------------------         PCT >= 0.50 ng/mL              PCT 0.26 - 0.50 ng/mL               AND        <80% decrease in PCT             Encourage initiation of                                             antibiotics       Encourage continuation           of antibiotics    ----------------------------     -----------------------------        PCT >= 0.50 ng/mL                  PCT > 0.50 ng/mL               AND         increase in PCT                  Strongly encourage                                      initiation of antibiotics    Strongly encourage escalation           of antibiotics                                     -----------------------------                                           PCT <= 0.25 ng/mL                                                 OR                                        > 80% decrease in PCT                                     Discontinue / Do not initiate                                             antibiotics Performed at [facility], [address] [place], [place] [address]   Lactate dehydrogenase     Status: Abnormal   Collection Time: 08/16/19  6:27 PM  Result Value Ref Range   LDH 207 (H) 98 - 192 U/L    Comment:  Performed at [facility], [address] [place], [place] [address]  Ferritin     Status: Abnormal   Collection Time: 08/16/19  6:27  PM  Result Value Ref Range   Ferritin 349 (H) 24 - 336 ng/mL    Comment: Performed at [facility], [address] [place], [place] [address]  Triglycerides     Status: None   Collection Time: 08/16/19  6:27 PM  Result Value Ref Range   Triglycerides 57 <150 mg/dL    Comment: Performed at [facility], [address] [place], [place] [address]  Fibrinogen     Status: None   Collection Time: 08/16/19  6:27 PM  Result Value Ref Range   Fibrinogen 413 210 - 475 mg/dL    Comment: Performed at [facility], [address] [place], [place] [address]  C-reactive protein     Status: Abnormal   Collection Time: 08/16/19  6:27 PM  Result Value Ref Range   CRP 5.1 (H) <1.0 mg/dL    Comment: Performed at [facility], [address] [place], [place] [address]  Lactic acid, plasma     Status: None   Collection Time: 08/16/19  9:05 PM  Result Value Ref Range   Lactic Acid, Venous 1.1 0.5 - 1.9 mmol/L    Comment: Performed at [facility], [address] [place], [place] [address]    Chemistries  Recent Labs  Lab 08/16/19 1827  NA 128*  K 4.0  CL 94*  CO2 21*  GLUCOSE 103*  BUN 28*  CREATININE 1.39*  CALCIUM 9.0  AST 22  ALT 18  ALKPHOS 84  BILITOT 0.7   ------------------------------------------------------------------------------------------------------------------  ------------------------------------------------------------------------------------------------------------------ GFR: Estimated Creatinine Clearance: 27.6 mL/min (A) (by C-G formula based on SCr of 1.39 mg/dL (H)). Liver Function Tests: Recent Labs  Lab 08/16/19 1827  AST 22  ALT 18  ALKPHOS 84  BILITOT 0.7  PROT 6.7  ALBUMIN 3.9    Recent Labs    08/16/19 1827  TRIG 57   Thyroid Function Tests: No results for input(s): TSH, T4TOTAL, FREET4, T3FREE, THYROIDAB in the last 72 hours. Anemia Panel: Recent Labs    08/16/19 1827  FERRITIN 349*     --------------------------------------------------------------------------------------------------------------- Urine analysis: No results found for: COLORURINE, APPEARANCEUR, LABSPEC, PHURINE, GLUCOSEU, HGBUR, BILIRUBINUR, KETONESUR, PROTEINUR, UROBILINOGEN, NITRITE, LEUKOCYTESUR    Imaging Results:    DG Chest Port 1 View  Result Date: 08/16/2019 CLINICAL DATA:  Shortness of breath for 2 days, COVID-19 positivity. EXAM: PORTABLE CHEST 1 VIEW COMPARISON:  None. FINDINGS: Cardiac shadow is enlarged. Pacing device is noted. Lungs are well aerated bilaterally. Patchy parenchymal opacities are noted bilaterally consistent with atypical pneumonia. No sizable effusion is seen. No bony abnormality is noted. IMPRESSION: Patchy opacities bilaterally consistent with atypical/viral pneumonia. This is consistent with the patient's given clinical history of COVID-19 positivity. Electronically Signed   By: Alcide CleverMark  Lukens M.D.   On: 08/16/2019 18:01    My personal review of EKG: Rhythm NSR,  LBBB   Assessment & Plan:    Active Problems:   Pneumonia due to COVID-19 virus   1. Pneumonia due to COVID-19 virus-chest x-ray shows patchy opacity bilaterally consistent with atypical/viral pneumonia.  Will start remdesivir per pharmacy consultation, Decadron 6 mg IV every 24 hours.  CRP is 5.1.  Follow CRP, ferritin, D-dimer in a.m.  2. Acute kidney injury-mild, BUN/creatinine is 28/1.310, likely from poor p.o. intake, HCTZ.  Will hold HCTZ at this time.  Patient will be started on  gentle IV hydration with normal saline at 50 mL/h for 12 hours.  Follow BMP in a.m.  Will avoid giving excessive IV fluids in the setting of Covid pneumonia.  3. Hyponatremia-sodium is 128, likely from dehydration/HCTZ.  HCTZ is currently on hold as above.  Started on gentle IV hydration as above.  Follow sodium level in a.m.   DVT Prophylaxis-   Lovenox   AM Labs Ordered, also please review Full Orders    Code Status: Full  code, I was unable to contact patient's family.  Need to confirm CODE STATUS with family in a.m.  Admission status: Inpatient: Based on patients clinical presentation and evaluation of above clinical data, I have made determination that patient meets Inpatient criteria at this time.  Time spent in minutes : 60 minutes   Asja Frommer S Ramiya Delahunty M.D

## 2019-08-18 LAB — CBC WITH DIFFERENTIAL/PLATELET
Abs Immature Granulocytes: 0.1 10*3/uL — ABNORMAL HIGH (ref 0.00–0.07)
Band Neutrophils: 34 %
Basophils Absolute: 0 10*3/uL (ref 0.0–0.1)
Basophils Relative: 0 %
Eosinophils Absolute: 0 10*3/uL (ref 0.0–0.5)
Eosinophils Relative: 0 %
HCT: 32.6 % — ABNORMAL LOW (ref 39.0–52.0)
Hemoglobin: 10.6 g/dL — ABNORMAL LOW (ref 13.0–17.0)
Lymphocytes Relative: 3 %
Lymphs Abs: 0.2 10*3/uL — ABNORMAL LOW (ref 0.7–4.0)
MCH: 28.8 pg (ref 26.0–34.0)
MCHC: 32.5 g/dL (ref 30.0–36.0)
MCV: 88.6 fL (ref 80.0–100.0)
Monocytes Absolute: 0.2 10*3/uL (ref 0.1–1.0)
Monocytes Relative: 3 %
Myelocytes: 1 %
Neutro Abs: 6.8 10*3/uL (ref 1.7–7.7)
Neutrophils Relative %: 59 %
Platelets: 163 10*3/uL (ref 150–400)
RBC: 3.68 MIL/uL — ABNORMAL LOW (ref 4.22–5.81)
RDW: 14.4 % (ref 11.5–15.5)
WBC Morphology: INCREASED
WBC: 7.3 10*3/uL (ref 4.0–10.5)
nRBC: 0 % (ref 0.0–0.2)

## 2019-08-18 LAB — COMPREHENSIVE METABOLIC PANEL
ALT: 24 U/L (ref 0–44)
AST: 51 U/L — ABNORMAL HIGH (ref 15–41)
Albumin: 3.3 g/dL — ABNORMAL LOW (ref 3.5–5.0)
Alkaline Phosphatase: 59 U/L (ref 38–126)
Anion gap: 9 (ref 5–15)
BUN: 33 mg/dL — ABNORMAL HIGH (ref 8–23)
CO2: 22 mmol/L (ref 22–32)
Calcium: 8.3 mg/dL — ABNORMAL LOW (ref 8.9–10.3)
Chloride: 104 mmol/L (ref 98–111)
Creatinine, Ser: 1.18 mg/dL (ref 0.61–1.24)
GFR calc Af Amer: 59 mL/min — ABNORMAL LOW (ref >60)
GFR calc non Af Amer: 51 mL/min — ABNORMAL LOW (ref >60)
Glucose, Bld: 105 mg/dL — ABNORMAL HIGH (ref 70–99)
Potassium: 3.6 mmol/L (ref 3.5–5.1)
Sodium: 135 mmol/L (ref 135–145)
Total Bilirubin: 1.1 mg/dL (ref 0.3–1.2)
Total Protein: 5.7 g/dL — ABNORMAL LOW (ref 6.5–8.1)

## 2019-08-18 LAB — FERRITIN: Ferritin: 406 ng/mL — ABNORMAL HIGH (ref 24–336)

## 2019-08-18 LAB — C-REACTIVE PROTEIN: CRP: 16.4 mg/dL — ABNORMAL HIGH (ref <1.0)

## 2019-08-18 LAB — D-DIMER, QUANTITATIVE: D-Dimer, Quant: 1.96 ug/mL-FEU — ABNORMAL HIGH (ref 0.00–0.50)

## 2019-08-18 MED ORDER — ENOXAPARIN SODIUM 40 MG/0.4ML ~~LOC~~ SOLN
40.0000 mg | SUBCUTANEOUS | Status: DC
  Administered 2019-08-19 – 2019-08-22 (×4): 40 mg via SUBCUTANEOUS
  Filled 2019-08-18 (×4): qty 0.4

## 2019-08-18 NOTE — Plan of Care (Signed)
Attempted to contact daughter to update on POC and patient status.  Received no answer and a voicemail that was full.  Also attempted to call wife, but # in chart says its been disconnected.  Will continue to try to reach daughter as able.

## 2019-08-18 NOTE — Progress Notes (Signed)
PROGRESS NOTE    Rada HayRichard Ludlam  UXL:[id number] DOB: [birth year] DOA: 08/16/2019 PCP: Kirstie PeriShah, Ashish, MD    Brief Narrative:   Patient is a 85 year old highly functioning male from [facility] presented to the emergency room with cough, congestion sore throat and runny nose with diagnosis of COVID-19 about 2 days ago.  He had few episodes of diarrhea but no vomiting.  In the emergency room, oxygen saturation was 98% on room air, mildly tachypneic.  COVID-19 positive.  Inflammatory markers elevated.  Procalcitonin 0.1.  Chest x-ray with bilateral pneumonia.  He was admitted to the hospital due to COVID-19 pneumonia.  Assessment & Plan:   Active Problems:   Pneumonia due to COVID-19 virus  Pneumonia due to COVID-19 virus: Continue to monitor.  He has minimal respiratory symptoms and mostly on room air. chest physiotherapy, incentive spirometry, deep breathing exercises, sputum induction, mucolytic's and bronchodilators. Supplemental oxygen to keep saturations more than 90% as needed. Covid directed therapy with , steroids, dexamethasone day 2 remdesivir, day 2/5 antibiotics, not indicated Due to severity of symptoms, patient will need daily inflammatory markers, liver function test to monitor and direct COVID-19 therapies.  Hypertension: Blood pressure stable on current regimen.  Continue.  Hyponatremic hypovolemia with acute kidney injury: Improved and normalized.  Can transfer to MedSurg unit.  Work with PT OT.  DVT prophylaxis: Lovenox subcu Code Status: Full code Family Communication: patient's daughter Selena BattenKim on the phone. Disposition Plan: Pending clinical improvement   Consultants:   None  Procedures:   None  Antimicrobials:   Remdesivir, 08/17/2019-----   Subjective: Patient seen and examined.  No overnight events.  Remains on room air.  He was eating breakfast in the morning.  Denies any shortness of breath.  Had few episodes of diarrhea before coming  to the hospital, however denies any ongoing diarrhea.  Objective: Vitals:   08/17/19 1930 08/17/19 2000 08/18/19 0409 08/18/19 0750  BP: 137/84   131/62  Pulse: 85 89  93  Resp: (!) 27 (!) 25  (!) 22  Temp:   97.8 F (36.6 C) 97.8 F (36.6 C)  TempSrc:   Oral Oral  SpO2: 100% 100%  100%  Weight:      Height:        Intake/Output Summary (Last 24 hours) at 08/18/2019 1343 Last data filed at 08/18/2019 1109 Gross per 24 hour  Intake 360 ml  Output 450 ml  Net -90 ml   Filed Weights   08/16/19 1722  Weight: 65.8 kg    Examination:  General exam: Appears calm and comfortable, on room air.  Eating breakfast. Respiratory system: Clear to auscultation. Respiratory effort normal. Cardiovascular system: S1 & S2 heard, RRR. No JVD, murmurs, rubs, gallops or clicks. No pedal edema. Gastrointestinal system: Abdomen is nondistended, soft and nontender. No organomegaly or masses felt. Normal bowel sounds heard. Central nervous system: Alert and oriented. No focal neurological deficits. Extremities: Symmetric 5 x 5 power. Skin: No rashes, lesions or ulcers Psychiatry: Judgement and insight appear normal. Mood & affect appropriate.     Data Reviewed: I have personally reviewed following labs and imaging studies  CBC: Recent Labs  Lab 08/16/19 1827 08/17/19 0348 08/18/19 0356  WBC 4.8 6.1 7.3  NEUTROABS 3.6 5.3 6.8  HGB 11.2* 11.5* 10.6*  HCT 35.3* 36.4* 32.6*  MCV 91.2 91.7 88.6  PLT 173 171 163   Basic Metabolic Panel: Recent Labs  Lab 08/16/19 1827 08/17/19 0348 08/18/19 0356  NA 128* 131* 135  K  4.0 4.1 3.6  CL 94* 99 104  CO2 21* 18* 22  GLUCOSE 103* 112* 105*  BUN 28* 31* 33*  CREATININE 1.39* 1.49* 1.18  CALCIUM 9.0 8.8* 8.3*   GFR: Estimated Creatinine Clearance: 32.5 mL/min (by C-G formula based on SCr of 1.18 mg/dL). Liver Function Tests: Recent Labs  Lab 08/16/19 1827 08/17/19 0348 08/18/19 0356  AST 22 26 51*  ALT 18 20 24   ALKPHOS 84 74 59   BILITOT 0.7 0.9 1.1  PROT 6.7 6.5 5.7*  ALBUMIN 3.9 3.7 3.3*   No results for input(s): LIPASE, AMYLASE in the last 168 hours. No results for input(s): AMMONIA in the last 168 hours. Coagulation Profile: No results for input(s): INR, PROTIME in the last 168 hours. Cardiac Enzymes: No results for input(s): CKTOTAL, CKMB, CKMBINDEX, TROPONINI in the last 168 hours. BNP (last 3 results) No results for input(s): PROBNP in the last 8760 hours. HbA1C: No results for input(s): HGBA1C in the last 72 hours. CBG: No results for input(s): GLUCAP in the last 168 hours. Lipid Profile: Recent Labs    08/16/19 1827  TRIG 57   Thyroid Function Tests: No results for input(s): TSH, T4TOTAL, FREET4, T3FREE, THYROIDAB in the last 72 hours. Anemia Panel: Recent Labs    08/17/19 0349 08/18/19 0356  FERRITIN 427* 406*   Sepsis Labs: Recent Labs  Lab 08/16/19 1827 08/16/19 2105  PROCALCITON <0.10  --   LATICACIDVEN 1.3 1.1    Recent Results (from the past 240 hour(s))  Blood Culture (routine x 2)     Status: None (Preliminary result)   Collection Time: 08/16/19  6:27 PM   Specimen: BLOOD  Result Value Ref Range Status   Specimen Description BLOOD  Final   Special Requests NONE  Final   Culture   Final    NO GROWTH 2 DAYS Performed at [facility], [address], [place], [place]    Report Status PENDING  Incomplete  Blood Culture (routine x 2)     Status: None (Preliminary result)   Collection Time: 08/16/19  9:04 PM   Specimen: Left Antecubital; Blood  Result Value Ref Range Status   Specimen Description LEFT ANTECUBITAL  Final   Special Requests NONE  Final   Culture   Final    NO GROWTH 2 DAYS Performed at [facility], [address], [place], [place]    Report Status PENDING  Incomplete         Radiology Studies: DG Chest Port 1 View  Result Date: 08/16/2019 CLINICAL DATA:  Shortness of breath for 2 days, COVID-19 positivity. EXAM: PORTABLE  CHEST 1 VIEW COMPARISON:  None. FINDINGS: Cardiac shadow is enlarged. Pacing device is noted. Lungs are well aerated bilaterally. Patchy parenchymal opacities are noted bilaterally consistent with atypical pneumonia. No sizable effusion is seen. No bony abnormality is noted. IMPRESSION: Patchy opacities bilaterally consistent with atypical/viral pneumonia. This is consistent with the patient's given clinical history of COVID-19 positivity. Electronically Signed   By: 08/18/2019 M.D.   On: 08/16/2019 18:01        Scheduled Meds: . amLODipine  10 mg Oral Daily  . atorvastatin  20 mg Oral Daily  . dexamethasone (DECADRON) injection  6 mg Intravenous Q24H  . [START ON 08/19/2019] enoxaparin (LOVENOX) injection  40 mg Subcutaneous Q24H  . irbesartan  150 mg Oral Daily  . rOPINIRole  0.25 mg Oral QHS  . sodium chloride flush  3 mL Intravenous Q12H  Continuous Infusions: . sodium chloride    . remdesivir 100 mg in NS 100 mL 100 mg (08/18/19 0825)     LOS: 2 days    Time spent: 25 minutes    Barb Merino, MD [facility] Pager [phone]

## 2019-08-19 LAB — CBC WITH DIFFERENTIAL/PLATELET
Abs Immature Granulocytes: 0.04 10*3/uL (ref 0.00–0.07)
Basophils Absolute: 0 10*3/uL (ref 0.0–0.1)
Basophils Relative: 0 %
Eosinophils Absolute: 0 10*3/uL (ref 0.0–0.5)
Eosinophils Relative: 0 %
HCT: 31.4 % — ABNORMAL LOW (ref 39.0–52.0)
Hemoglobin: 10.5 g/dL — ABNORMAL LOW (ref 13.0–17.0)
Immature Granulocytes: 1 %
Lymphocytes Relative: 4 %
Lymphs Abs: 0.3 10*3/uL — ABNORMAL LOW (ref 0.7–4.0)
MCH: 28.9 pg (ref 26.0–34.0)
MCHC: 33.4 g/dL (ref 30.0–36.0)
MCV: 86.5 fL (ref 80.0–100.0)
Monocytes Absolute: 0.6 10*3/uL (ref 0.1–1.0)
Monocytes Relative: 8 %
Neutro Abs: 6.9 10*3/uL (ref 1.7–7.7)
Neutrophils Relative %: 87 %
Platelets: 193 10*3/uL (ref 150–400)
RBC: 3.63 MIL/uL — ABNORMAL LOW (ref 4.22–5.81)
RDW: 14.5 % (ref 11.5–15.5)
WBC: 7.9 10*3/uL (ref 4.0–10.5)
nRBC: 0 % (ref 0.0–0.2)

## 2019-08-19 LAB — COMPREHENSIVE METABOLIC PANEL
ALT: 26 U/L (ref 0–44)
AST: 46 U/L — ABNORMAL HIGH (ref 15–41)
Albumin: 3.1 g/dL — ABNORMAL LOW (ref 3.5–5.0)
Alkaline Phosphatase: 57 U/L (ref 38–126)
Anion gap: 10 (ref 5–15)
BUN: 37 mg/dL — ABNORMAL HIGH (ref 8–23)
CO2: 19 mmol/L — ABNORMAL LOW (ref 22–32)
Calcium: 8.3 mg/dL — ABNORMAL LOW (ref 8.9–10.3)
Chloride: 106 mmol/L (ref 98–111)
Creatinine, Ser: 1.13 mg/dL (ref 0.61–1.24)
GFR calc Af Amer: >60 mL/min (ref >60)
GFR calc non Af Amer: 54 mL/min — ABNORMAL LOW (ref >60)
Glucose, Bld: 114 mg/dL — ABNORMAL HIGH (ref 70–99)
Potassium: 4.1 mmol/L (ref 3.5–5.1)
Sodium: 135 mmol/L (ref 135–145)
Total Bilirubin: 0.8 mg/dL (ref 0.3–1.2)
Total Protein: 5.5 g/dL — ABNORMAL LOW (ref 6.5–8.1)

## 2019-08-19 LAB — FERRITIN: Ferritin: 462 ng/mL — ABNORMAL HIGH (ref 24–336)

## 2019-08-19 LAB — C-REACTIVE PROTEIN: CRP: 9.8 mg/dL — ABNORMAL HIGH (ref <1.0)

## 2019-08-19 LAB — D-DIMER, QUANTITATIVE: D-Dimer, Quant: 1.36 ug/mL-FEU — ABNORMAL HIGH (ref 0.00–0.50)

## 2019-08-19 MED ORDER — BRINZOLAMIDE 1 % OP SUSP
1.0000 [drp] | Freq: Three times a day (TID) | OPHTHALMIC | Status: DC
  Administered 2019-08-19 – 2019-08-22 (×9): 1 [drp] via OPHTHALMIC
  Filled 2019-08-19: qty 10

## 2019-08-19 MED ORDER — BRIMONIDINE TARTRATE 0.2 % OP SOLN
1.0000 [drp] | Freq: Three times a day (TID) | OPHTHALMIC | Status: DC
  Administered 2019-08-19 – 2019-08-22 (×9): 1 [drp] via OPHTHALMIC
  Filled 2019-08-19: qty 5

## 2019-08-19 NOTE — [place] FL2 (Signed)
[place] MEDICAID FL2 LEVEL OF CARE SCREENING TOOL     IDENTIFICATION  Patient Name: Tom Chavez Birthdate: 08-23-1919 Sex: male Admission Date (Current Location): 08/16/2019  [facility] and [place] Number:  Producer, television/film/video and Address:         Provider Number: [id number]  Attending Physician Name and Address:  Tyrone Nine, MD  Relative Name and Phone Number:       Current Level of Care: Hospital Recommended Level of Care: Skilled Nursing Facility Prior Approval Number:    Date Approved/Denied:   PASRR Number: [id number]  Discharge Plan: SNF    Current Diagnoses: Patient Active Problem List   Diagnosis Date Noted  . Pneumonia due to COVID-19 virus 08/16/2019    Orientation RESPIRATION BLADDER Height & Weight     Self, Time, Situation, Place  Normal Continent Weight: 145 lb (65.8 kg) Height:  5\' 7"  (170.2 cm)  BEHAVIORAL SYMPTOMS/MOOD NEUROLOGICAL BOWEL NUTRITION STATUS      Continent    AMBULATORY STATUS COMMUNICATION OF NEEDS Skin   Limited Assist Verbally Normal                       Personal Care Assistance Level of Assistance  Bathing, Feeding, Dressing Bathing Assistance: Limited assistance Feeding assistance: Limited assistance Dressing Assistance: Limited assistance     Functional Limitations Info  Hearing          SPECIAL CARE FACTORS FREQUENCY  PT (By licensed PT), OT (By licensed OT)                    Contractures Contractures Info: Not present    Additional Factors Info  Code Status Code Status Info: FULL CODE             Current Medications (08/19/2019):  This is the current hospital active medication list Current Facility-Administered Medications  Medication Dose Route Frequency Provider Last Rate Last Admin  . 0.9 %  sodium chloride infusion  250 mL Intravenous PRN 08/21/2019, MD      . amLODipine (NORVASC) tablet 10 mg  10 mg Oral Daily Meredeth Ide, MD   10 mg at 08/19/19 0956  . atorvastatin  (LIPITOR) tablet 20 mg  20 mg Oral Daily 08/21/19, MD   20 mg at 08/19/19 0956  . brinzolamide (AZOPT) 1 % ophthalmic suspension 1 drop  1 drop Both Eyes TID 08/21/19, MD       And  . brimonidine (ALPHAGAN) 0.2 % ophthalmic solution 1 drop  1 drop Both Eyes TID Tyrone Nine, MD      . dexamethasone (DECADRON) injection 6 mg  6 mg Intravenous Q24H Tyrone Nine, MD   6 mg at 08/19/19 0457  . enoxaparin (LOVENOX) injection 40 mg  40 mg Subcutaneous Q24H Absher, 08/21/19, RPH   40 mg at 08/19/19 0457  . irbesartan (AVAPRO) tablet 150 mg  150 mg Oral Daily 08/21/19, MD   150 mg at 08/19/19 1131  . remdesivir 100 mg in sodium chloride 0.9 % 100 mL IVPB  100 mg Intravenous Daily 08/21/19, MD   Stopped at 08/19/19 1029  . rOPINIRole (REQUIP) tablet 0.25 mg  0.25 mg Oral QHS 08/21/19, MD   0.25 mg at 08/18/19 2247  . sodium chloride flush (NS) 0.9 % injection 3 mL  3 mL Intravenous Q12H 2248, MD   3 mL at 08/19/19 0957  .  sodium chloride flush (NS) 0.9 % injection 3 mL  3 mL Intravenous PRN Oswald Hillock, MD         Discharge Medications: Please see discharge summary for a list of discharge medications.  Relevant Imaging Results:  Relevant Lab Results:   Additional Information SS# [id number]  Amador Cunas, [place]

## 2019-08-19 NOTE — Evaluation (Signed)
Physical Therapy Evaluation Patient Details Name: Tom Chavez MRN: [id number] DOB: 21-Aug-1919 Today's Date: 08/19/2019   History of Present Illness  Patient is a 85 year old highly functioning male from retirement community presented to the emergency room with cough, congestion sore throat and runny nose with diagnosis of COVID-19 about 2 days ago.  He had few episodes of diarrhea but no vomiting.  In the emergency room, oxygen saturation was 98% on room air, mildly tachypneic.  COVID-19 positive.  Inflammatory markers elevated.  Procalcitonin 0.1.  Chest x-ray with bilateral pneumonia.  He was admitted to the hospital due to COVID-19 pneumonia.  Clinical Impression  The patient is very pleasant. Ambulated x 120' withRW and  RA . SPO2 93%, dyspnea 2/4. Patient should progress to return to ALF, if not, recommend SNF. Pt admitted with above diagnosis.  Pt currently with functional limitations due to the deficits listed below (see PT Problem List). Pt will benefit from skilled PT to increase their independence and safety with mobility to allow discharge to the venue listed below.       Follow Up Recommendations Home health PT(return to ALF), SNF if unable to return    Equipment Recommendations  None recommended by PT    Recommendations for Other Services       Precautions / Restrictions Precautions Precautions: Fall      Mobility  Bed Mobility               General bed mobility comments: in recliner  Transfers Overall transfer level: Needs assistance Equipment used: Rolling walker (2 wheeled) Transfers: Sit to/from Stand Sit to Stand: Min guard;Min assist         General transfer comment: min guard from high recliner  Ambulation/Gait Ambulation/Gait assistance: Min assist Gait Distance (Feet): 120 Feet Assistive device: Rolling walker (2 wheeled) Gait Pattern/deviations: Step-to pattern;Step-through pattern;Trunk flexed;Decreased stride length Gait velocity:  decr   General Gait Details: slight loss of balance x 1 when turning.  Stairs            Wheelchair Mobility    Modified Rankin (Stroke Patients Only)       Balance Overall balance assessment: Needs assistance   Sitting balance-Leahy Scale: Good     Standing balance support: Bilateral upper extremity supported   Standing balance comment: reliant on UE's                             Pertinent Vitals/Pain      Home Living Family/patient expects to be discharged to:: Assisted living               Home Equipment: Walker - 2 wheels      Prior Function Level of Independence: Needs assistance   Gait / Transfers Assistance Needed: Pt walks short distances with RW around facility. Pt reports 2 falls in the last 6 months.  ADL's / Homemaking Assistance Needed: Pt reports being independent in ADLs with facility completing IADLs. Pt does not drive.         Hand Dominance        Extremity/Trunk Assessment   Upper Extremity Assessment Upper Extremity Assessment: Generalized weakness    Lower Extremity Assessment Lower Extremity Assessment: Generalized weakness    Cervical / Trunk Assessment Cervical / Trunk Assessment: Kyphotic  Communication      Cognition   Behavior During Therapy: WFL for tasks assessed/performed Overall Cognitive Status: No family/caregiver present to determine baseline cognitive functioning  General Comments: Pt A&O x 4      General Comments      Exercises Other Exercises Other Exercises: IS 10 x's per hour   Assessment/Plan    PT Assessment Patient needs continued PT services  PT Problem List Decreased strength;Decreased mobility;Decreased safety awareness;Decreased knowledge of precautions;Decreased activity tolerance;Decreased balance       PT Treatment Interventions DME instruction;Therapeutic activities;Gait training;Therapeutic exercise;Patient/family  education;Functional mobility training    PT Goals (Current goals can be found in the Care Plan section)  Acute Rehab PT Goals Patient Stated Goal: To go home PT Goal Formulation: With patient Time For Goal Achievement: 09/02/19 Potential to Achieve Goals: Good    Frequency Min 3X/week   Barriers to discharge        Co-evaluation               AM-PAC PT "6 Clicks" Mobility  Outcome Measure Help needed turning from your back to your side while in a flat bed without using bedrails?: A Lot Help needed moving from lying on your back to sitting on the side of a flat bed without using bedrails?: A Lot Help needed moving to and from a bed to a chair (including a wheelchair)?: A Little Help needed standing up from a chair using your arms (e.g., wheelchair or bedside chair)?: A Little Help needed to walk in hospital room?: A Little Help needed climbing 3-5 steps with a railing? : A Lot 6 Click Score: 15    End of Session Equipment Utilized During Treatment: Gait belt Activity Tolerance: Patient tolerated treatment well Patient left: in chair;with call bell/phone within reach Nurse Communication: Mobility status PT Visit Diagnosis: Unsteadiness on feet (R26.81)    Time: 9983-3825 PT Time Calculation (min) (ACUTE ONLY): 20 min   Charges:   PT Evaluation $PT Eval Low Complexity: [place] Pager [phone] Office [phone]   Claretha Cooper 08/19/2019, 1:57 PM

## 2019-08-19 NOTE — Progress Notes (Signed)
Occupational Therapy Evaluation Patient Details Name: Tom Chavez MRN: [id number] DOB: 07-Apr-1919 Today's Date: 08/19/2019    History of Present Illness Patient is a 85 year old highly functioning male from retirement community presented to the emergency room with cough, congestion sore throat and runny nose with diagnosis of COVID-19 about 2 days ago.  He had few episodes of diarrhea but no vomiting.  In the emergency room, oxygen saturation was 98% on room air, mildly tachypneic.  COVID-19 positive.  Inflammatory markers elevated.  Procalcitonin 0.1.  Chest x-ray with bilateral pneumonia.  He was admitted to the hospital due to COVID-19 pneumonia.   Clinical Impression   PTA pt lived at an assisted living facility, independent with mobility and self-care tasks. Pt reports ambulating with a RW and states that he's had 2 falls in the last 6 months. Pt currently requires setup to min assist for self-care and functional transfer tasks. Pt able to ambulate to/from bathroom with RW and min guard. Noted 0 instances of loss of balance, however pt unsteady on his feet requiring cues to slow pace in order to increase balance and safety. Pt able to complete toileting task as well as grooming/hygiene at the sink. Pt on room air with Sp02 at 94% following activity. Pt demonstrates decreased strength, endurance, balance, standing tolerance, and activity tolerance impacting ability to complete self-care and functional transfer tasks. Recommend skilled OT services to address above deficits in order to promote function and prevent further decline. Recommend HH OT for continued rehab following hospital discharge.     Follow Up Recommendations  Home health OT;Supervision/Assistance - 24 hour(Back to ALF)    Equipment Recommendations  3 in 1 bedside commode(for use in shower)    Recommendations for Other Services       Precautions / Restrictions Precautions Precautions: Fall Restrictions Weight Bearing  Restrictions: No      Mobility Bed Mobility Overal bed mobility: Needs Assistance Bed Mobility: Supine to Sit     Supine to sit: Mod assist     General bed mobility comments: Mod assist with trunk  Transfers Overall transfer level: Needs assistance Equipment used: Rolling walker (2 wheeled) Transfers: Sit to/from Stand Sit to Stand: Min guard;Min assist         General transfer comment: Initially min guard, progressing to min assist due to fatigue.    Balance Overall balance assessment: Needs assistance   Sitting balance-Leahy Scale: Good       Standing balance-Leahy Scale: Poor                             ADL either performed or assessed with clinical judgement   ADL Overall ADL's : Needs assistance/impaired Eating/Feeding: Set up;Sitting   Grooming: Min guard;Standing Grooming Details (indicate cue type and reason): Standing at the sink to wash hands and wash face Upper Body Bathing: Sitting;Set up;Supervision/ safety   Lower Body Bathing: Minimal assistance;Sitting/lateral leans;Sit to/from stand   Upper Body Dressing : Supervision/safety;Set up;Sitting   Lower Body Dressing: Minimal assistance;Sit to/from stand;Sitting/lateral leans   Toilet Transfer: Minimal assistance;Grab Engineer, miningbars;Regular Toilet;Ambulation   Toileting- Clothing Manipulation and Hygiene: Minimal assistance;Sit to/from stand;Sitting/lateral lean       Functional mobility during ADLs: Min guard;Rolling walker General ADL Comments: Pt able to ambulate to/from bathroom with RW and min guard. Noted 0 instances of LOB. Pt required cues to slow pace.     Vision Baseline Vision/History: Wears glasses Wears Glasses: At all times  Perception     Praxis      Pertinent Vitals/Pain Pain Assessment: No/denies pain     Hand Dominance Right   Extremity/Trunk Assessment Upper Extremity Assessment Upper Extremity Assessment: Generalized weakness   Lower Extremity  Assessment Lower Extremity Assessment: Defer to PT evaluation       Communication Communication Communication: HOH(extremely HOH, left ear better than right)   Cognition Arousal/Alertness: Awake/alert Behavior During Therapy: WFL for tasks assessed/performed Overall Cognitive Status: No family/caregiver present to determine baseline cognitive functioning                                 General Comments: Pt A&O x 4   General Comments  Pt on RA with SpO2 at 94% following activity. Educated pt on safety strategies and activity pacing techniques to increase balance and prevent future falls. Pt able to ambulate to/from bathroom and complete hygiene tasks at the sink.     Exercises     Shoulder Instructions      Home Living Family/patient expects to be discharged to:: Assisted living                             Home Equipment: Walker - 2 wheels          Prior Functioning/Environment Level of Independence: Needs assistance  Gait / Transfers Assistance Needed: Pt walks short distances with RW around facility. Pt reports 2 falls in the last 6 months. ADL's / Homemaking Assistance Needed: Pt reports being independent in ADLs with facility completing IADLs. Pt does not drive.             OT Problem List: Decreased strength;Decreased activity tolerance;Impaired balance (sitting and/or standing);Cardiopulmonary status limiting activity      OT Treatment/Interventions: Self-care/ADL training;Therapeutic exercise;Neuromuscular education;Energy conservation;DME and/or AE instruction;Therapeutic activities;Patient/family education;Balance training    OT Goals(Current goals can be found in the care plan section) Acute Rehab OT Goals Patient Stated Goal: To go home Time For Goal Achievement: 09/02/19 Potential to Achieve Goals: Good ADL Goals Pt Will Perform Grooming: with modified independence;standing Pt Will Perform Lower Body Bathing: with modified  independence;sit to/from stand Pt Will Perform Lower Body Dressing: with modified independence;sit to/from stand Pt Will Transfer to Toilet: with modified independence;ambulating Pt Will Perform Toileting - Clothing Manipulation and hygiene: with modified independence;sit to/from stand Additional ADL Goal #1: Pt to recall and verbalize 3 fall prevention strategies with 0 verbal cues.  OT Frequency: Min 3X/week   Barriers to D/C:            Co-evaluation              AM-PAC OT "6 Clicks" Daily Activity     Outcome Measure Help from another person eating meals?: A Little Help from another person taking care of personal grooming?: A Little Help from another person toileting, which includes using toliet, bedpan, or urinal?: A Little Help from another person bathing (including washing, rinsing, drying)?: A Little Help from another person to put on and taking off regular upper body clothing?: A Little Help from another person to put on and taking off regular lower body clothing?: A Little 6 Click Score: 18   End of Session Equipment Utilized During Treatment: Rolling walker Nurse Communication: Mobility status  Activity Tolerance: Patient tolerated treatment well Patient left: in chair;with call bell/phone within reach  OT Visit Diagnosis: Unsteadiness on feet (  R26.81);Muscle weakness (generalized) (M62.81)                Time: 5427-0623 OT Time Calculation (min): 37 min Charges:  OT General Charges $OT Visit: 1 Visit OT Evaluation $OT Eval Moderate Complexity: 1 Mod OT Treatments $Self Care/Home Management : 8-22 mins  Peterson Ao OTR/L [phone]   Peterson Ao 08/19/2019, 9:15 AM

## 2019-08-19 NOTE — Progress Notes (Signed)
PROGRESS NOTE  Tom Chavez  BPZ:[id number] DOB: July 30, 1919 DOA: 08/16/2019 PCP: Monico Blitz, MD   Brief Narrative: Patient is a 85 year old highly functioning male from retirement community presented to the emergency room with cough, congestion sore throat and runny nose with diagnosis of COVID-19 about 2 days ago.  He had few episodes of diarrhea but no vomiting.  In the emergency room, oxygen saturation was 98% on room air, mildly tachypneic.  COVID-19 positive.  Inflammatory markers elevated.  Procalcitonin 0.1.  Chest x-ray with bilateral pneumonia.  He was admitted to the hospital due to COVID-19 pneumonia.  Assessment & Plan: Active Problems:   Pneumonia due to COVID-19 virus  Covid-19 pneumonia:  - Continue PT/OT to avoid deconditioning.  - Complete 5 days remdesivir (12/14 - 12/18) - Continue steroids (started 12/14 >> ) - Trend CRP(16.4 > 9.8) and monitor ALT while on remdesivir  HTN:  - Continue current Tx with norvasc and ARB  Hypovolemic hyponatremia: Resolved.  - Monitor with po intake, holding HCTZ  AKI on stage IIIb CKD: Cr 1.49 from baseline of 1.1, has returned to baseline w/CrCl 54ml/min.  - Avoid nephrotoxins, ok to continue ARB  Restless legs syndrome:  - Continue ropinirole qHS  Hyperlipidemia:  - Continue statin  Anemia of chronic renal disease: Stable.  - Monitor intermittently.  AST elevation: [place], most likely from covid-19 infection.  DVT prophylaxis: Lovenox Code Status: Full Family Communication: Will discuss with family by phone Disposition Plan: Return to ALF with DME and home health services once completed remdesivir (12/18).   Consultants:   None  Procedures:   None  Antimicrobials:  Remdesivir 12/14 - 12/18   Subjective: Feels cold (it is cold in the room), sitting in chair with no complaints. Short of breath mildly, intermittently only with exertion, and overall this is improving. Feels diffusely weak but also improving.  Eating ok. Legs are always swollen.   Objective: Vitals:   08/18/19 0750 08/18/19 1959 08/19/19 0500 08/19/19 0838  BP: 131/62 132/67 (!) 134/57 133/62  Pulse: 93 85 80 89  Resp: (!) 22 20 20  (!) 22  Temp: 97.8 F (36.6 C) 98.2 F (36.8 C) 97.7 F (36.5 C) 97.9 F (36.6 C)  TempSrc: Oral Oral Oral Oral  SpO2: 100% 92% (!) 88% 97%  Weight:      Height:        Intake/Output Summary (Last 24 hours) at 08/19/2019 1145 Last data filed at 08/19/2019 2423 Gross per 24 hour  Intake 608 ml  Output 300 ml  Net 308 ml   Filed Weights   08/16/19 1722  Weight: 65.8 kg    Gen: Pleasant, well-appearing elderly male in chair Pulm: Non-labored breathing room air. Clear to auscultation bilaterally.  CV: Regular rate and rhythm. No murmur, rub, or gallop. No JVD, 1+ symmetric pedal edema. GI: Abdomen soft, non-tender, non-distended, with normoactive bowel sounds. No organomegaly or masses felt. Ext: Warm, no deformities Skin: No rashes, lesions or ulcers Neuro: Alert and oriented. No focal neurological deficits. Psych: Judgement and insight appear normal. Mood & affect appropriate.   Data Reviewed: I have personally reviewed following labs and imaging studies  CBC: Recent Labs  Lab 08/16/19 1827 08/17/19 0348 08/18/19 0356 08/19/19 0500  WBC 4.8 6.1 7.3 7.9  NEUTROABS 3.6 5.3 6.8 6.9  HGB 11.2* 11.5* 10.6* 10.5*  HCT 35.3* 36.4* 32.6* 31.4*  MCV 91.2 91.7 88.6 86.5  PLT 173 171 163 536   Basic Metabolic Panel: Recent Labs  Lab 08/16/19  1827 08/17/19 0348 08/18/19 0356 08/19/19 0500  NA 128* 131* 135 135  K 4.0 4.1 3.6 4.1  CL 94* 99 104 106  CO2 21* 18* 22 19*  GLUCOSE 103* 112* 105* 114*  BUN 28* 31* 33* 37*  CREATININE 1.39* 1.49* 1.18 1.13  CALCIUM 9.0 8.8* 8.3* 8.3*   GFR: Estimated Creatinine Clearance: 34 mL/min (by C-G formula based on SCr of 1.13 mg/dL). Liver Function Tests: Recent Labs  Lab 08/16/19 1827 08/17/19 0348 08/18/19 0356 08/19/19 0500   AST 22 26 51* 46*  ALT 18 20 24 26   ALKPHOS 84 74 59 57  BILITOT 0.7 0.9 1.1 0.8  PROT 6.7 6.5 5.7* 5.5*  ALBUMIN 3.9 3.7 3.3* 3.1*   No results for input(s): LIPASE, AMYLASE in the last 168 hours. No results for input(s): AMMONIA in the last 168 hours. Coagulation Profile: No results for input(s): INR, PROTIME in the last 168 hours. Cardiac Enzymes: No results for input(s): CKTOTAL, CKMB, CKMBINDEX, TROPONINI in the last 168 hours. BNP (last 3 results) No results for input(s): PROBNP in the last 8760 hours. HbA1C: No results for input(s): HGBA1C in the last 72 hours. CBG: No results for input(s): GLUCAP in the last 168 hours. Lipid Profile: Recent Labs    08/16/19 1827  TRIG 57   Thyroid Function Tests: No results for input(s): TSH, T4TOTAL, FREET4, T3FREE, THYROIDAB in the last 72 hours. Anemia Panel: Recent Labs    08/18/19 0356 08/19/19 0420  FERRITIN 406* 462*   Urine analysis: No results found for: COLORURINE, APPEARANCEUR, LABSPEC, PHURINE, GLUCOSEU, HGBUR, BILIRUBINUR, KETONESUR, PROTEINUR, UROBILINOGEN, NITRITE, LEUKOCYTESUR Recent Results (from the past 240 hour(s))  Blood Culture (routine x 2)     Status: None (Preliminary result)   Collection Time: 08/16/19  6:27 PM   Specimen: BLOOD  Result Value Ref Range Status   Specimen Description BLOOD  Final   Special Requests NONE  Final   Culture   Final    NO GROWTH 3 DAYS Performed at [facility], [address] [place], [place]    Report Status PENDING  Incomplete  Blood Culture (routine x 2)     Status: None (Preliminary result)   Collection Time: 08/16/19  9:04 PM   Specimen: Left Antecubital; Blood  Result Value Ref Range Status   Specimen Description LEFT ANTECUBITAL  Final   Special Requests NONE  Final   Culture   Final    NO GROWTH 3 DAYS Performed at [facility], [address], [place], [place]    Report Status PENDING  Incomplete      Radiology Studies: No results  found.  Scheduled Meds: . amLODipine  10 mg Oral Daily  . atorvastatin  20 mg Oral Daily  . dexamethasone (DECADRON) injection  6 mg Intravenous Q24H  . enoxaparin (LOVENOX) injection  40 mg Subcutaneous Q24H  . irbesartan  150 mg Oral Daily  . rOPINIRole  0.25 mg Oral QHS  . sodium chloride flush  3 mL Intravenous Q12H   Continuous Infusions: . sodium chloride    . remdesivir 100 mg in NS 100 mL 100 mg (08/19/19 1029)     LOS: 3 days   Time spent: 25 minutes.  08/21/19, MD [facility] [email or web address] 08/19/2019, 11:45 AM

## 2019-08-19 NOTE — Social Work (Signed)
Pt admitted from [facility] (retirement home). Spoke to [employer] [phone] [facility] Physicist, medical) and Pamala Hurry [phone] Psychiatrist for [place] facilities) and both refuse to accept pt back to [facility] until he is COVID (-).   Notified pt's dtr, Maudie Mercury [phone], wife Juliann Pulse and stepdtr Vaughan Basta [phone] who are upset pt not able to return to his facility. Discussed need for SNF placement as family indicates unable to take pt home. Will initiate SNF search and f/u with offers.   Wandra Feinstein, MSW, LCSW [phone] ([facility] coverage)

## 2019-08-20 LAB — COMPREHENSIVE METABOLIC PANEL
ALT: 29 U/L (ref 0–44)
AST: 37 U/L (ref 15–41)
Albumin: 3 g/dL — ABNORMAL LOW (ref 3.5–5.0)
Alkaline Phosphatase: 66 U/L (ref 38–126)
Anion gap: 10 (ref 5–15)
BUN: 40 mg/dL — ABNORMAL HIGH (ref 8–23)
CO2: 19 mmol/L — ABNORMAL LOW (ref 22–32)
Calcium: 8.3 mg/dL — ABNORMAL LOW (ref 8.9–10.3)
Chloride: 106 mmol/L (ref 98–111)
Creatinine, Ser: 1.22 mg/dL (ref 0.61–1.24)
GFR calc Af Amer: 57 mL/min — ABNORMAL LOW (ref >60)
GFR calc non Af Amer: 49 mL/min — ABNORMAL LOW (ref >60)
Glucose, Bld: 137 mg/dL — ABNORMAL HIGH (ref 70–99)
Potassium: 4.2 mmol/L (ref 3.5–5.1)
Sodium: 135 mmol/L (ref 135–145)
Total Bilirubin: 0.6 mg/dL (ref 0.3–1.2)
Total Protein: 5.5 g/dL — ABNORMAL LOW (ref 6.5–8.1)

## 2019-08-20 LAB — C-REACTIVE PROTEIN: CRP: 6.7 mg/dL — ABNORMAL HIGH (ref <1.0)

## 2019-08-20 MED ORDER — SODIUM CHLORIDE 0.9 % IV SOLN
100.0000 mg | Freq: Every day | INTRAVENOUS | Status: AC
  Administered 2019-08-21 – 2019-08-22 (×2): 100 mg via INTRAVENOUS
  Filled 2019-08-20 (×2): qty 20

## 2019-08-20 MED ORDER — ACETAMINOPHEN 325 MG PO TABS
650.0000 mg | ORAL_TABLET | Freq: Four times a day (QID) | ORAL | Status: DC | PRN
  Administered 2019-08-20 – 2019-08-21 (×2): 650 mg via ORAL
  Filled 2019-08-20 (×2): qty 2

## 2019-08-20 MED ORDER — GUAIFENESIN-DM 100-10 MG/5ML PO SYRP
10.0000 mL | ORAL_SOLUTION | ORAL | Status: DC | PRN
  Administered 2019-08-20 – 2019-08-21 (×2): 10 mL via ORAL
  Filled 2019-08-20 (×2): qty 10

## 2019-08-20 MED ORDER — SODIUM CHLORIDE 0.9 % IV SOLN: 100.0000 mg | Freq: Every day | INTRAVENOUS | Status: DC

## 2019-08-20 NOTE — Progress Notes (Signed)
Remdesivir Dosing: Patient received 200mg  loading dose of Remdesivir on 12/14.  He received 100mg  maintenance dose on 12/15 but missed 12/16 due to loss of IV access.  He received his 2nd maintenance dose today and I have extended his treatment to complete his course with last dose to be given on 12/19. Rober Minion, PharmD., MS Clinical Pharmacist Pager:  [phone] Thank you for allowing pharmacy to be part of this patients care team.

## 2019-08-20 NOTE — Progress Notes (Addendum)
PROGRESS NOTE  Tom Chavez  MPN:[id number] DOB: [birth year] DOA: 08/16/2019 PCP: Kirstie Peri, MD   Brief Narrative: Patient is a 85 year old highly functioning male from retirement community presented to the emergency room with cough, congestion sore throat and runny nose with diagnosis of COVID-19 about 2 days ago.  He had few episodes of diarrhea but no vomiting.  In the emergency room, oxygen saturation was 98% on room air, mildly tachypneic.  COVID-19 positive.  Inflammatory markers elevated.  Procalcitonin 0.1.  Chest x-ray with bilateral pneumonia.  He was admitted to the hospital due to COVID-19 pneumonia.  Assessment & Plan: Active Problems:   Pneumonia due to COVID-19 virus  Covid-19 pneumonia:  - Continue PT/OT to avoid deconditioning.   - Complete 5 days remdesivir - Continue steroids (started 12/14 >> ) - Trend CRP(16.4 > 9.8 > 6.7) and monitor ALT (normal) while on remdesivir  HTN:  - Continue current Tx with norvasc and ARB  Hypovolemic hyponatremia: Resolved.  - Monitor with po intake, holding HCTZ  AKI on stage IIIb CKD: Cr 1.49 from baseline of 1.1, has returned to baseline  - Avoid nephrotoxins, ok to continue ARB  Restless legs syndrome:  - Continue ropinirole qHS  Hyperlipidemia:  - Continue statin  Anemia of chronic renal disease: Stable.  - Monitor intermittently.  AST elevation: [place], most likely from covid-19 infection. Resolved.  DVT prophylaxis: Lovenox Code Status: Full Family Communication: Spoke with family 12/16. No answer and voicemail not set up on my attempted call today. Disposition Plan: Plan was return to ALF with DME and home health services once completed remdesivir, but they have declined to take him back due to covid positive status. Will pursue SNF per PT recommendations. 5th dose of remdesivir scheduled for 12/19 at 10am.   Consultants:   None  Procedures:   None  Antimicrobials:  Remdesivir 12/14 x5 doses (missed  12/16 dose for unclear reasons)   Subjective: Breathing better, wants to go home but facility won't take him back. He does still feel fatigued but getting around ok w/help. Intermittent cough, no chest pain.   Objective: Vitals:   08/19/19 2035 08/20/19 0537 08/20/19 0811 08/20/19 0841  BP: 128/71 (!) 113/48 (!) 148/54 (!) 144/56  Pulse: 96 73 97 79  Resp: 16 19    Temp: 98 F (36.7 C) 97.6 F (36.4 C) 97.9 F (36.6 C) 97.7 F (36.5 C)  TempSrc: Oral Oral Oral Oral  SpO2: 98% 93% 97%   Weight:      Height:        Intake/Output Summary (Last 24 hours) at 08/20/2019 0956 Last data filed at 08/20/2019 0600 Gross per 24 hour  Intake 540 ml  Output 400 ml  Net 140 ml   Filed Weights   08/16/19 1722  Weight: 65.8 kg   Gen: Pleasant well-appearing elderly male in no distress Pulm: Nonlabored breathing room air. Clear. CV: Regular rate and rhythm. No murmur, rub, or gallop. No JVD, 1+ pitting LE dependent edema. GI: Abdomen soft, non-tender, non-distended, with normoactive bowel sounds.  Ext: Warm, no deformities Skin: No new rashes, lesions or ulcers on visualized skin. Neuro: Alert and oriented. No focal neurological deficits. Psych: Judgement and insight appear fair. Mood euthymic & affect congruent. Behavior is appropriate.    Data Reviewed: I have personally reviewed following labs and imaging studies  CBC: Recent Labs  Lab 08/16/19 1827 08/17/19 0348 08/18/19 0356 08/19/19 0500  WBC 4.8 6.1 7.3 7.9  NEUTROABS 3.6 5.3 6.8 6.9  HGB 11.2* 11.5* 10.6* 10.5*  HCT 35.3* 36.4* 32.6* 31.4*  MCV 91.2 91.7 88.6 86.5  PLT 173 171 163 588   Basic Metabolic Panel: Recent Labs  Lab 08/16/19 1827 08/17/19 0348 08/18/19 0356 08/19/19 0500 08/20/19 0445  NA 128* 131* 135 135 135  K 4.0 4.1 3.6 4.1 4.2  CL 94* 99 104 106 106  CO2 21* 18* 22 19* 19*  GLUCOSE 103* 112* 105* 114* 137*  BUN 28* 31* 33* 37* 40*  CREATININE 1.39* 1.49* 1.18 1.13 1.22  CALCIUM 9.0 8.8*  8.3* 8.3* 8.3*   GFR: Estimated Creatinine Clearance: 31.5 mL/min (by C-G formula based on SCr of 1.22 mg/dL). Liver Function Tests: Recent Labs  Lab 08/16/19 1827 08/17/19 0348 08/18/19 0356 08/19/19 0500 08/20/19 0445  AST 22 26 51* 46* 37  ALT 18 20 24 26 29   ALKPHOS 84 74 59 57 66  BILITOT 0.7 0.9 1.1 0.8 0.6  PROT 6.7 6.5 5.7* 5.5* 5.5*  ALBUMIN 3.9 3.7 3.3* 3.1* 3.0*   Anemia Panel: Recent Labs    08/18/19 0356 08/19/19 0420  FERRITIN 406* 462*   Recent Results (from the past 240 hour(s))  Blood Culture (routine x 2)     Status: None (Preliminary result)   Collection Time: 08/16/19  6:27 PM   Specimen: BLOOD  Result Value Ref Range Status   Specimen Description BLOOD  Final   Special Requests NONE  Final   Culture   Final    NO GROWTH 4 DAYS Performed at [facility], [address], [place], [place] [address]    Report Status PENDING  Incomplete  Blood Culture (routine x 2)     Status: None (Preliminary result)   Collection Time: 08/16/19  9:04 PM   Specimen: Left Antecubital; Blood  Result Value Ref Range Status   Specimen Description LEFT ANTECUBITAL  Final   Special Requests NONE  Final   Culture   Final    NO GROWTH 4 DAYS Performed at [facility], [address], [place], [place] [address]    Report Status PENDING  Incomplete      Radiology Studies: No results found.  Scheduled Meds: . amLODipine  10 mg Oral Daily  . atorvastatin  20 mg Oral Daily  . brinzolamide  1 drop Both Eyes TID   And  . brimonidine  1 drop Both Eyes TID  . dexamethasone (DECADRON) injection  6 mg Intravenous Q24H  . enoxaparin (LOVENOX) injection  40 mg Subcutaneous Q24H  . irbesartan  150 mg Oral Daily  . rOPINIRole  0.25 mg Oral QHS  . sodium chloride flush  3 mL Intravenous Q12H   Continuous Infusions: . sodium chloride    . remdesivir 100 mg in NS 100 mL 100 mg (08/20/19 0944)     LOS: 4 days   Time spent: 25 minutes.  Patrecia Pour, MD [facility] [email or web address] 08/20/2019, 9:56 AM

## 2019-08-20 NOTE — Progress Notes (Signed)
Notified daughter of progress.  All questions were answered and this nurse's contact number shared for further communication.   

## 2019-08-20 NOTE — Plan of Care (Signed)
  Problem: Education: Goal: Knowledge of risk factors and measures for prevention of condition will improve Outcome: Progressing   Problem: Coping: Goal: Psychosocial and spiritual needs will be supported Outcome: Progressing   Problem: Respiratory: Goal: Will maintain a patent airway Outcome: Progressing Goal: Complications related to the disease process, condition or treatment will be avoided or minimized Outcome: Progressing   

## 2019-08-20 NOTE — TOC Progression Note (Addendum)
Transition of Care [facility]) - Progression Note    Patient Details  Name: Tom Chavez MRN: [id number] Date of Birth: 12/28/1918  Transition of [facility] CM/SW Contact  Wandra Feinstein [place], [place] Phone Number: 08/20/2019, 10:10 AM  Clinical Narrative:   Pt's family has accepted bed at [facility]. Per MD, anticipate pt ready for dc Saturday 12/19. Spoke to [place] at [facility] who confirmed they are able to accept over weekend. Pt's family updated. Pt's family does want to be notified at [place] (Kim/dtr [phone], Linda/stepdtr [phone]).  Wandra Feinstein, MSW, LCSW [phone] (GV coverage)            Expected Discharge Plan and Services                                                 Social Determinants of Health (SDOH) Interventions    Readmission Risk Interventions No flowsheet data found.

## 2019-08-20 NOTE — Care Management Important Message (Signed)
Important Message  Patient Details  Name: Tom Chavez MRN: [id number] Date of Birth: [birth year]   Medicare Important Message Given:  Yes - Important Message mailed due to current National Emergency  Verbal consent obtained due to current National Emergency  Relationship to patient: Child Contact Name: Reine Just Call Date: 08/20/19  Time: 1525 Phone: [phone] Outcome: Spoke with contact Important Message mailed to: (Email: [email or web address])       [name] 08/20/2019, 3:26 PM

## 2019-08-20 NOTE — Progress Notes (Signed)
Attempted to call patients daughter Maudie Mercury, but no answer. Will try again later. Patient's condom cath leaking; so I cleaned him and replaced with a new one.

## 2019-08-21 ENCOUNTER — Encounter (HOSPITAL_COMMUNITY): Payer: Self-pay | Admitting: Internal Medicine

## 2019-08-21 ENCOUNTER — Inpatient Hospital Stay (HOSPITAL_COMMUNITY): Payer: Medicare Other

## 2019-08-21 ENCOUNTER — Encounter (HOSPITAL_COMMUNITY): Payer: Medicare Other

## 2019-08-21 DIAGNOSIS — N183 Chronic kidney disease, stage 3 unspecified: Secondary | ICD-10-CM

## 2019-08-21 DIAGNOSIS — E785 Hyperlipidemia, unspecified: Secondary | ICD-10-CM

## 2019-08-21 DIAGNOSIS — G2581 Restless legs syndrome: Secondary | ICD-10-CM

## 2019-08-21 DIAGNOSIS — I159 Secondary hypertension, unspecified: Secondary | ICD-10-CM

## 2019-08-21 DIAGNOSIS — R609 Edema, unspecified: Secondary | ICD-10-CM

## 2019-08-21 DIAGNOSIS — I10 Essential (primary) hypertension: Secondary | ICD-10-CM

## 2019-08-21 DIAGNOSIS — N1832 Chronic kidney disease, stage 3b: Secondary | ICD-10-CM

## 2019-08-21 LAB — COMPREHENSIVE METABOLIC PANEL
ALT: 35 U/L (ref 0–44)
AST: 36 U/L (ref 15–41)
Albumin: 3 g/dL — ABNORMAL LOW (ref 3.5–5.0)
Alkaline Phosphatase: 58 U/L (ref 38–126)
Anion gap: 8 (ref 5–15)
BUN: 40 mg/dL — ABNORMAL HIGH (ref 8–23)
CO2: 22 mmol/L (ref 22–32)
Calcium: 8.3 mg/dL — ABNORMAL LOW (ref 8.9–10.3)
Chloride: 102 mmol/L (ref 98–111)
Creatinine, Ser: 1.08 mg/dL (ref 0.61–1.24)
GFR calc Af Amer: >60 mL/min (ref >60)
GFR calc non Af Amer: 57 mL/min — ABNORMAL LOW (ref >60)
Glucose, Bld: 121 mg/dL — ABNORMAL HIGH (ref 70–99)
Potassium: 4.5 mmol/L (ref 3.5–5.1)
Sodium: 132 mmol/L — ABNORMAL LOW (ref 135–145)
Total Bilirubin: 1.2 mg/dL (ref 0.3–1.2)
Total Protein: 5.3 g/dL — ABNORMAL LOW (ref 6.5–8.1)

## 2019-08-21 LAB — CULTURE, BLOOD (ROUTINE X 2)
Culture: NO GROWTH
Culture: NO GROWTH
Special Requests: ADEQUATE

## 2019-08-21 LAB — C-REACTIVE PROTEIN: CRP: 3.7 mg/dL — ABNORMAL HIGH (ref <1.0)

## 2019-08-21 MED ORDER — OXYCODONE-ACETAMINOPHEN 5-325 MG PO TABS
1.0000 | ORAL_TABLET | Freq: Three times a day (TID) | ORAL | Status: DC | PRN
  Administered 2019-08-21: 1 via ORAL
  Filled 2019-08-21: qty 1

## 2019-08-21 NOTE — Plan of Care (Signed)
  Problem: Education: Goal: Knowledge of risk factors and measures for prevention of condition will improve Outcome: Progressing   Problem: Coping: Goal: Psychosocial and spiritual needs will be supported Outcome: Progressing   Problem: Respiratory: Goal: Will maintain a patent airway Outcome: Progressing Goal: Complications related to the disease process, condition or treatment will be avoided or minimized Outcome: Progressing   

## 2019-08-21 NOTE — Progress Notes (Signed)
Physical Therapy Treatment Patient Details Name: Tom Chavez MRN: [id number] DOB: March 21, 1919 Today's Date: 08/21/2019    History of Present Illness Patient is a 85 year old highly functioning male from retirement community presented to the emergency room with cough, congestion sore throat and runny nose with diagnosis of COVID-19 about 2 days ago.  He had few episodes of diarrhea but no vomiting.  In the emergency room, oxygen saturation was 98% on room air, mildly tachypneic.  COVID-19 positive.  Inflammatory markers elevated.  Procalcitonin 0.1.  Chest x-ray with bilateral pneumonia.  He was admitted to the hospital due to COVID-19 pneumonia.    PT Comments    The patient is always willing to ambul;ate and reports feels good to get OOB. Patient will plan tp Dc to SNF prior to return to ALF. Continue PT. SPO2 on RA 98%.   Follow Up Recommendations  SNF     Equipment Recommendations  None recommended by PT    Recommendations for Other Services       Precautions / Restrictions Precautions Precautions: Fall    Mobility  Bed Mobility   Bed Mobility: Supine to Sit     Supine to sit: Mod assist     General bed mobility comments: use of rail, rocking to scoot, assist with trunk  Transfers Overall transfer level: Needs assistance Equipment used: Rolling walker (2 wheeled) Transfers: Sit to/from Stand Sit to Stand: Min assist         General transfer comment: from low bed  Ambulation/Gait Ambulation/Gait assistance: Min guard Gait Distance (Feet): 120 Feet Assistive device: Rolling walker (2 wheeled) Gait Pattern/deviations: Step-to pattern;Step-through pattern;Trunk flexed;Decreased stride length Gait velocity: decr   General Gait Details: no balance losses, likes to manage thr RW" I got it" "I like to walk"   Stairs             Wheelchair Mobility    Modified Rankin (Stroke Patients Only)       Balance           Standing balance support:  Bilateral upper extremity supported Standing balance-Leahy Scale: Fair                              Cognition Arousal/Alertness: Awake/alert Behavior During Therapy: WFL for tasks assessed/performed                                          Exercises      General Comments        Pertinent Vitals/Pain Pain Assessment: No/denies pain    Home Living                      Prior Function            PT Goals (current goals can now be found in the care plan section) Progress towards PT goals: Progressing toward goals    Frequency    Min 2X/week      PT Plan Discharge plan needs to be updated;Frequency needs to be updated    Co-evaluation              AM-PAC PT "6 Clicks" Mobility   Outcome Measure  Help needed turning from your back to your side while in a flat bed without using bedrails?: A Lot Help needed moving from lying on your back to  sitting on the side of a flat bed without using bedrails?: A Lot Help needed moving to and from a bed to a chair (including a wheelchair)?: A Little Help needed standing up from a chair using your arms (e.g., wheelchair or bedside chair)?: A Little Help needed to walk in hospital room?: A Little Help needed climbing 3-5 steps with a railing? : A Lot 6 Click Score: 15    End of Session Equipment Utilized During Treatment: Gait belt Activity Tolerance: Patient tolerated treatment well Patient left: in chair;with call bell/phone within reach Nurse Communication: Mobility status PT Visit Diagnosis: Unsteadiness on feet (R26.81)     Time: 3888-2800 PT Time Calculation (min) (ACUTE ONLY): 38 min  Charges:  $Gait Training: 23-37 mins                     [place] Pager [phone] Office [phone]    Claretha Cooper 08/21/2019, 12:25 PM

## 2019-08-21 NOTE — Progress Notes (Signed)
Lower extremity venous has been completed.   Preliminary results in CV Proc.   Abram Sander 08/21/2019 3:55 PM

## 2019-08-21 NOTE — Progress Notes (Signed)
PROGRESS NOTE    Tom Chavez  ZOX:[id number] DOB: [birth year] DOA: 08/16/2019 PCP: Monico Blitz, MD    Brief Narrative:  85 year old male presented with dyspnea.  He does have significant past medical history for hypertension and dyslipidemia.  He reported cough, congestion and runny nose lung with dyspnea.  On his initial physical examination his blood pressure was 129/51, pulse rate 85, temperature 98.9, respiratory 22, oxygen saturation 98%.  His lungs had bilateral rhonchi, heart S1-S2 present rhythmic, abdomen soft, no lower extremity edema.  His chest radiograph had bilateral interstitial infiltrates.   Patient has been responding well to medical therapy with remdesivir and systemic corticosteroids.  Assessment & Plan:   Principal Problem:   Pneumonia due to COVID-19 virus Active Problems:   HTN (hypertension)   CKD (chronic kidney disease), stage III   Dyslipidemia   Restless leg syndrome   1. Acute hypoxic respiratory failure due to SARS COVID 19 viral pneumonia.   RR: 18  Pulse oxymetry:= 91  Fi02: 21% room air.   Marland Kitchen COVID-19 Labs  Recent Labs    08/19/19 0420 08/19/19 0500 08/20/19 0500 08/21/19 0325  DDIMER  --  1.36*  --   --   FERRITIN 462*  --   --   --   CRP 9.8*  --  6.7* 3.7*    No results found for: SARSCOV2NAA  Inflammatory markers are trending down.  Will continue medical therapy with remdesivir #4/5, systemic corticosteroids with dexamethasone, antitussive agents and airway clearing techniques with flutter valve and incentive spirometer.   2. HTN. Continue blood pressure control with amlodipine and irbesartan.  3. Dyslipidemia. Continue with statin therapy.  4. Restless leg syndrome. Continue with ropinarole. Will add oxycodone for further analgesia for now. Will check doppler US lower extremities to rule out DVT.    DVT prophylaxis: enoxaparin   Code Status:  full Family Communication: no family at the bedside  Disposition Plan/  discharge barriers: pending clinical improvement.   Body mass index is 22.71 kg/m. Malnutrition Type:      Malnutrition Characteristics:      Nutrition Interventions:     RN Pressure Injury Documentation:     Consultants:     Procedures:     Antimicrobials:       Subjective: Patient complains of bilateral leg pain, moderate in intensity, worse to touch and movement, associated with edema, no dyspnea or chest pain, no nausea or vomiting, positive decreased hearing.   Objective: Vitals:   08/20/19 0841 08/20/19 1943 08/21/19 0555 08/21/19 0816  BP: (!) 144/56 132/63 90/75 127/62  Pulse: 79 81 73 73  Resp:  19 20 16   Temp: 97.7 F (36.5 C) 98.7 F (37.1 C) 98 F (36.7 C) 98.2 F (36.8 C)  TempSrc: Oral Axillary Axillary Oral  SpO2:  97% 94% 94%  Weight:      Height:        Intake/Output Summary (Last 24 hours) at 08/21/2019 8119 Last data filed at 08/21/2019 0600 Gross per 24 hour  Intake 240 ml  Output 500 ml  Net -260 ml   Filed Weights   08/16/19 1722  Weight: 65.8 kg    Examination:   General: Not in pain or dyspnea, deconditioned  Neurology: Awake and alert, non focal  E ENT: mild pallor, no icterus, oral mucosa moist Cardiovascular: No JVD. S1-S2 present, rhythmic, no gallops, rubs, or murmurs. ++ bilateral non pitting lower extremity edema. Pulmonary: positive breath sounds bilaterally, adequate air movement, no wheezing, rhonchi or  rales. Gastrointestinal. Abdomen with no organomegaly, non tender, no rebound or guarding Skin. No rashes Musculoskeletal: no joint deformities     Data Reviewed: I have personally reviewed following labs and imaging studies  CBC: Recent Labs  Lab 08/16/19 1827 08/17/19 0348 08/18/19 0356 08/19/19 0500  WBC 4.8 6.1 7.3 7.9  NEUTROABS 3.6 5.3 6.8 6.9  HGB 11.2* 11.5* 10.6* 10.5*  HCT 35.3* 36.4* 32.6* 31.4*  MCV 91.2 91.7 88.6 86.5  PLT 173 171 163 193   Basic Metabolic Panel: Recent Labs   Lab 08/17/19 0348 08/18/19 0356 08/19/19 0500 08/20/19 0445 08/21/19 0325  NA 131* 135 135 135 132*  K 4.1 3.6 4.1 4.2 4.5  CL 99 104 106 106 102  CO2 18* 22 19* 19* 22  GLUCOSE 112* 105* 114* 137* 121*  BUN 31* 33* 37* 40* 40*  CREATININE 1.49* 1.18 1.13 1.22 1.08  CALCIUM 8.8* 8.3* 8.3* 8.3* 8.3*   GFR: Estimated Creatinine Clearance: 35.5 mL/min (by C-G formula based on SCr of 1.08 mg/dL). Liver Function Tests: Recent Labs  Lab 08/17/19 0348 08/18/19 0356 08/19/19 0500 08/20/19 0445 08/21/19 0325  AST 26 51* 46* 37 36  ALT 20 24 26 29  35  ALKPHOS 74 59 57 66 58  BILITOT 0.9 1.1 0.8 0.6 1.2  PROT 6.5 5.7* 5.5* 5.5* 5.3*  ALBUMIN 3.7 3.3* 3.1* 3.0* 3.0*   No results for input(s): LIPASE, AMYLASE in the last 168 hours. No results for input(s): AMMONIA in the last 168 hours. Coagulation Profile: No results for input(s): INR, PROTIME in the last 168 hours. Cardiac Enzymes: No results for input(s): CKTOTAL, CKMB, CKMBINDEX, TROPONINI in the last 168 hours. BNP (last 3 results) No results for input(s): PROBNP in the last 8760 hours. HbA1C: No results for input(s): HGBA1C in the last 72 hours. CBG: No results for input(s): GLUCAP in the last 168 hours. Lipid Profile: No results for input(s): CHOL, HDL, LDLCALC, TRIG, CHOLHDL, LDLDIRECT in the last 72 hours. Thyroid Function Tests: No results for input(s): TSH, T4TOTAL, FREET4, T3FREE, THYROIDAB in the last 72 hours. Anemia Panel: Recent Labs    08/19/19 0420  FERRITIN 462*      Radiology Studies: I have reviewed all of the imaging during this hospital visit personally     Scheduled Meds: . amLODipine  10 mg Oral Daily  . atorvastatin  20 mg Oral Daily  . brinzolamide  1 drop Both Eyes TID   And  . brimonidine  1 drop Both Eyes TID  . dexamethasone (DECADRON) injection  6 mg Intravenous Q24H  . enoxaparin (LOVENOX) injection  40 mg Subcutaneous Q24H  . irbesartan  150 mg Oral Daily  . rOPINIRole   0.25 mg Oral QHS  . sodium chloride flush  3 mL Intravenous Q12H   Continuous Infusions: . sodium chloride    . remdesivir 100 mg in NS 100 mL 100 mg (08/21/19 0845)     LOS: 5 days        Brenda Samano 08/23/19, MD

## 2019-08-22 LAB — D-DIMER, QUANTITATIVE: D-Dimer, Quant: 1.28 ug/mL-FEU — ABNORMAL HIGH (ref 0.00–0.50)

## 2019-08-22 LAB — COMPREHENSIVE METABOLIC PANEL
ALT: 35 U/L (ref 0–44)
AST: 27 U/L (ref 15–41)
Albumin: 3.2 g/dL — ABNORMAL LOW (ref 3.5–5.0)
Alkaline Phosphatase: 61 U/L (ref 38–126)
Anion gap: 8 (ref 5–15)
BUN: 38 mg/dL — ABNORMAL HIGH (ref 8–23)
CO2: 21 mmol/L — ABNORMAL LOW (ref 22–32)
Calcium: 8.5 mg/dL — ABNORMAL LOW (ref 8.9–10.3)
Chloride: 104 mmol/L (ref 98–111)
Creatinine, Ser: 1.1 mg/dL (ref 0.61–1.24)
GFR calc Af Amer: >60 mL/min (ref >60)
GFR calc non Af Amer: 56 mL/min — ABNORMAL LOW (ref >60)
Glucose, Bld: 133 mg/dL — ABNORMAL HIGH (ref 70–99)
Potassium: 4.2 mmol/L (ref 3.5–5.1)
Sodium: 133 mmol/L — ABNORMAL LOW (ref 135–145)
Total Bilirubin: 1.1 mg/dL (ref 0.3–1.2)
Total Protein: 5.5 g/dL — ABNORMAL LOW (ref 6.5–8.1)

## 2019-08-22 LAB — FERRITIN: Ferritin: 369 ng/mL — ABNORMAL HIGH (ref 24–336)

## 2019-08-22 LAB — C-REACTIVE PROTEIN: CRP: 2.2 mg/dL — ABNORMAL HIGH (ref <1.0)

## 2019-08-22 MED ORDER — GUAIFENESIN-DM 100-10 MG/5ML PO SYRP: 10.0000 mL | ORAL_SOLUTION | Freq: Four times a day (QID) | ORAL | 0 refills | Status: AC | PRN

## 2019-08-22 MED ORDER — ENOXAPARIN SODIUM 40 MG/0.4ML ~~LOC~~ SOLN: 40.0000 mg | SUBCUTANEOUS | 0 refills | Status: AC

## 2019-08-22 NOTE — Progress Notes (Signed)
[facility] arrived to transport patient to facility. Per pt's request, condom cath remains intact. VSS stable and documented. Pt escorted by EMS transporters x2. Belongings sent with patient.

## 2019-08-22 NOTE — Plan of Care (Addendum)
Patient to be discharged today per MD order. Discharge education discussed with patient and daughter, Reine Just. Report called to [facility]. Will await transport.   Problem: Education: Goal: Knowledge of risk factors and measures for prevention of condition will improve Outcome: Adequate for Discharge   Problem: Coping: Goal: Psychosocial and spiritual needs will be supported Outcome: Adequate for Discharge   Problem: Respiratory: Goal: Will maintain a patent airway Outcome: Adequate for Discharge Goal: Complications related to the disease process, condition or treatment will be avoided or minimized Outcome: Adequate for Discharge

## 2019-08-22 NOTE — TOC Transition Note (Signed)
Transition of Care [facility] - CM/SW Discharge Note   Patient Details  Name: Tom Chavez MRN: [id number] Date of Birth: Dec 16, 1918  Transition of Care [facility]) CM/SW Contact:  Geralynn Ochs, LCSW Phone Number: 08/22/2019, 2:01 PM   Clinical Narrative:   Nurse to call report to [phone], Room 204.    Final next level of care: [place] Barriers to Discharge: Barriers Resolved   Patient Goals and CMS Choice        Discharge Placement              Patient chooses bed at: [facility] Patient to be transferred to facility by: [place] Name of family member notified: Kim Patient and family notified of of transfer: 08/22/19  Discharge Plan and Services                                     Social Determinants of Health (SDOH) Interventions     Readmission Risk Interventions No flowsheet data found.

## 2019-08-22 NOTE — Discharge Summary (Addendum)
Physician Discharge Summary  Rada HayRichard Gerdeman MVH:[id number] DOB: 07-30-1919 DOA: 08/16/2019  PCP: Kirstie PeriShah, Ashish, MD  Admit date: 08/16/2019 Discharge date: 08/22/2019  Admitted From:  Assisted living Disposition:  SNF.   Recommendations for Outpatient Follow-up and new medication changes:  1. Follow up with Dr. Sherryll BurgerShah in 2 weeks 2. Continue DVT prophylaxis with enoxaparin 40 mg daily for 30 days.   Home Health: na   Equipment/Devices: na    Discharge Condition: stable  CODE STATUS: full  Diet recommendation: heart healthy   Brief/Interim Summary: 85 year old male presented with dyspnea.  He does have significant past medical history for hypertension and dyslipidemia.  He reported cough, congestion and runny nose along with dyspnea.  On his initial physical examination his blood pressure was 129/51, pulse rate 85, temperature 98.9, respiratory rate 22, oxygen saturation 98%.  His lungs had bilateral rhonchi, heart S1-S2 present rhythmic, abdomen soft, trace non pitting lower extremity edema.  Sodium 128, potassium 4.0, chloride 94, bicarb 21, glucose 103, BUN 28, creatinine 1.38,  white count 4.8, hemoglobin 9.2, hematocrit 35.3, platelets 173.  SARS COVID-19 positive.  His chest radiograph had bilateral interstitial infiltrates.  EKG 84 bpm, left axis deviation, left bundle branch block, corrected QTC 476, sinus rhythm, ST depressions lead I aVL, J-point elevation V1 to V4, T wave inversions lead I aVL, poor R wave progression.  Patient was admitted to the hospital with working diagnosis of acute hypoxic respiratory failure due to SARS COVID-19 viral pneumonia.  Patient responded well to medical therapy with remdesivir and systemic corticosteroids.   1.  Acute hypoxic respiratory failure due to SARS COVID-19 viral pneumonia.  Patient was admitted to the medical ward, he received supplemental oxygen per nasal cannula, medical therapy with remdesivir and systemic corticosteroids.  He was  treated with antitussive agents and airway clearing techniques with flutter valve and incentive spirometer.  Patient's symptoms and inflammatory markers improved. His oxygen saturation at discharge is 93% to 95% on room air.  His peak D-dimer was 1.97, at discharge still elevated 1.28, further work-up with ultrasonography of his lower extremities was negative for deep vein thrombosis.  Considering patient's age, decreased mobility and elevated D-dimer above 1 patient will be discharged on DVT prophylaxis, enoxaparin 40 mg daily for 3 days.  2.  Hypertension.  Pressure been well controlled with amlodipine in the irbesartan. At discharge will resume valsartan and HCTZ combination.   3.  Dyslipidemia.  Continue statin therapy.  4.  Restless leg syndrome.  Patient was continued on ropinirole.   Patient was seen by physical therapy, recommendation to continue physical therapy at a skilled nursing facility.  5. AKI on CKD stage 3a. Patient received supportive medical therapy with improvement of his renal function, serum cr at discharge is 1,10 with K at 4,2 and serum bicarbonate at 21.   I spoke with patient's daughter over the phone and all questions were addressed.  Discharge Diagnoses:  Principal Problem:   Pneumonia due to COVID-19 virus Active Problems:   HTN (hypertension)   CKD (chronic kidney disease), stage III   Dyslipidemia   Restless leg syndrome    Discharge Instructions   Allergies as of 08/22/2019   No Known Allergies     Medication List    TAKE these medications   amLODipine 10 MG tablet Commonly known as: NORVASC Take 10 mg by mouth daily.   atorvastatin 20 MG tablet Commonly known as: LIPITOR Take 20 mg by mouth daily.   enoxaparin 40 MG/0.4ML injection Commonly  known as: LOVENOX Inject 0.4 mLs (40 mg total) into the skin daily. Start taking on: August 23, 2019   guaiFENesin-dextromethorphan 100-10 MG/5ML syrup Commonly known as: ROBITUSSIN DM Take  10 mLs by mouth every 6 (six) hours as needed for cough.   rOPINIRole 0.25 MG tablet Commonly known as: REQUIP Take 0.25 mg by mouth at bedtime.   Simbrinza 1-0.2 % Susp Generic drug: Brinzolamide-Brimonidine Place 1 drop into both eyes 3 (three) times daily.   valsartan-hydrochlorothiazide 160-25 MG tablet Commonly known as: DIOVAN-HCT Take 1 tablet by mouth daily.       Contact information for follow-up providers    Monico Blitz, MD Follow up in 2 week(s).   Specialty: Internal Medicine Contact information: [address] [place] [id number] [phone]            Contact information for after-discharge care    [facility] .   Service: Skilled Nursing Contact information: [address] [place] [phone]                 No Known Allergies  Consultations:     Procedures/Studies: DG Chest Port 1 View  Result Date: 08/16/2019 CLINICAL DATA:  Shortness of breath for 2 days, COVID-19 positivity. EXAM: PORTABLE CHEST 1 VIEW COMPARISON:  None. FINDINGS: Cardiac shadow is enlarged. Pacing device is noted. Lungs are well aerated bilaterally. Patchy parenchymal opacities are noted bilaterally consistent with atypical pneumonia. No sizable effusion is seen. No bony abnormality is noted. IMPRESSION: Patchy opacities bilaterally consistent with atypical/viral pneumonia. This is consistent with the patient's given clinical history of COVID-19 positivity. Electronically Signed   By: Inez Catalina M.D.   On: 08/16/2019 18:01   VAS [place] LOWER EXTREMITY VENOUS (DVT)  Result Date: 08/21/2019  Lower Venous Study Indications: Edema.  Comparison Study: no prior Performing Technologist: Abram Sander RVS  Examination Guidelines: A complete evaluation includes B-mode imaging, spectral Doppler, color Doppler, and power Doppler as needed of all accessible portions of each vessel. Bilateral testing is considered an  integral part of a complete examination. Limited examinations for reoccurring indications may be performed as noted.  +---------+---------------+---------+-----------+----------+--------------+ RIGHT    CompressibilityPhasicitySpontaneityPropertiesThrombus Aging +---------+---------------+---------+-----------+----------+--------------+ CFV      Full           Yes      Yes                                 +---------+---------------+---------+-----------+----------+--------------+ SFJ      Full                                                        +---------+---------------+---------+-----------+----------+--------------+ FV Prox  Full                                                        +---------+---------------+---------+-----------+----------+--------------+ FV Mid   Full                                                        +---------+---------------+---------+-----------+----------+--------------+  FV DistalFull                                                        +---------+---------------+---------+-----------+----------+--------------+ PFV      Full                                                        +---------+---------------+---------+-----------+----------+--------------+ POP      Full           Yes      Yes                                 +---------+---------------+---------+-----------+----------+--------------+ PTV      Full                                                        +---------+---------------+---------+-----------+----------+--------------+ PERO     Full                                                        +---------+---------------+---------+-----------+----------+--------------+   +---------+---------------+---------+-----------+----------+--------------+ LEFT     CompressibilityPhasicitySpontaneityPropertiesThrombus Aging +---------+---------------+---------+-----------+----------+--------------+ CFV       Full           Yes      Yes                                 +---------+---------------+---------+-----------+----------+--------------+ SFJ      Full                                                        +---------+---------------+---------+-----------+----------+--------------+ FV Prox  Full                                                        +---------+---------------+---------+-----------+----------+--------------+ FV Mid   Full                                                        +---------+---------------+---------+-----------+----------+--------------+ FV DistalFull                                                        +---------+---------------+---------+-----------+----------+--------------+  PFV      Full                                                        +---------+---------------+---------+-----------+----------+--------------+ POP      Full           Yes      Yes                                 +---------+---------------+---------+-----------+----------+--------------+ PTV      Full                                                        +---------+---------------+---------+-----------+----------+--------------+ PERO     Full                                                        +---------+---------------+---------+-----------+----------+--------------+     Summary: Right: There is no evidence of deep vein thrombosis in the lower extremity. No cystic structure found in the popliteal fossa. Left: There is no evidence of deep vein thrombosis in the lower extremity. No cystic structure found in the popliteal fossa.  *See table(s) above for measurements and observations.    Preliminary       Procedures:   Subjective: Patient is feeling better, dyspnea has resolved, no chest pain, no cough and no further lower extremity pain. No nausea or vomiting.   Discharge Exam: Vitals:   08/22/19 0500 08/22/19 0823  BP: (!) 142/71  (!) 149/68  Pulse: 76 84  Resp: (!) 22 20  Temp: 97.7 F (36.5 C) 99.3 F (37.4 C)  SpO2: 95% 93%   Vitals:   08/21/19 1631 08/21/19 2100 08/22/19 0500 08/22/19 0823  BP: (!) 130/57 (!) 141/61 (!) 142/71 (!) 149/68  Pulse: 74  76 84  Resp: 18 (!) 24 (!) 22 20  Temp: 97.6 F (36.4 C) 97.6 F (36.4 C) 97.7 F (36.5 C) 99.3 F (37.4 C)  TempSrc: Oral Oral Oral Oral  SpO2: 91% 97% 95% 93%  Weight:      Height:        General: Not in pain or dyspnea.  Neurology: Awake and alert, non focal  E ENT: no pallor, no icterus, oral mucosa moist Cardiovascular: No JVD. S1-S2 present, rhythmic, no gallops, rubs, or murmurs. No lower extremity edema. Pulmonary: positive breath sounds bilaterally. Gastrointestinal. Abdomen with no organomegaly, non tender, no rebound or guarding Skin. No rashes Musculoskeletal: no joint deformities   The results of significant diagnostics from this hospitalization (including imaging, microbiology, ancillary and laboratory) are listed below for reference.     Microbiology: Recent Results (from the past 240 hour(s))  Blood Culture (routine x 2)     Status: None   Collection Time: 08/16/19  6:27 PM   Specimen: BLOOD RIGHT ARM  Result Value Ref Range Status   Specimen Description BLOOD RIGHT ARM  Final   Special Requests  Final    BOTTLES DRAWN AEROBIC AND ANAEROBIC Blood Culture results may not be optimal due to an excessive volume of blood received in culture bottles   Culture   Final    NO GROWTH 5 DAYS Performed at [facility], [address] [place], [place] [address]    Report Status 08/21/2019 FINAL  Final  Blood Culture (routine x 2)     Status: None   Collection Time: 08/16/19  9:04 PM   Specimen: Left Antecubital; Blood  Result Value Ref Range Status   Specimen Description LEFT ANTECUBITAL  Final   Special Requests   Final    BOTTLES DRAWN AEROBIC AND ANAEROBIC Blood Culture adequate volume   Culture   Final    NO GROWTH 5  DAYS Performed at [facility], [address] [place], [place] [address]    Report Status 08/21/2019 FINAL  Final     Labs: BNP (last 3 results) No results for input(s): BNP in the last 8760 hours. Basic Metabolic Panel: Recent Labs  Lab 08/18/19 0356 08/19/19 0500 08/20/19 0445 08/21/19 0325 08/22/19 0542  NA 135 135 135 132* 133*  K 3.6 4.1 4.2 4.5 4.2  CL 104 106 106 102 104  CO2 22 19* 19* 22 21*  GLUCOSE 105* 114* 137* 121* 133*  BUN 33* 37* 40* 40* 38*  CREATININE 1.18 1.13 1.22 1.08 1.10  CALCIUM 8.3* 8.3* 8.3* 8.3* 8.5*   Liver Function Tests: Recent Labs  Lab 08/18/19 0356 08/19/19 0500 08/20/19 0445 08/21/19 0325 08/22/19 0542  AST 51* 46* 37 36 27  ALT 35 35  ALKPHOS 59 57 66 58 61  BILITOT 1.1 0.8 0.6 1.2 1.1  PROT 5.7* 5.5* 5.5* 5.3* 5.5*  ALBUMIN 3.3* 3.1* 3.0* 3.0* 3.2*   No results for input(s): LIPASE, AMYLASE in the last 168 hours. No results for input(s): AMMONIA in the last 168 hours. CBC: Recent Labs  Lab 08/16/19 1827 08/17/19 0348 08/18/19 0356 08/19/19 0500  WBC 4.8 6.1 7.3 7.9  NEUTROABS 3.6 5.3 6.8 6.9  HGB 11.2* 11.5* 10.6* 10.5*  HCT 35.3* 36.4* 32.6* 31.4*  MCV 91.2 91.7 88.6 86.5  PLT 173 171 163 193   Cardiac Enzymes: No results for input(s): CKTOTAL, CKMB, CKMBINDEX, TROPONINI in the last 168 hours. BNP: Invalid input(s): POCBNP CBG: No results for input(s): GLUCAP in the last 168 hours. D-Dimer Recent Labs    08/22/19 0542  DDIMER 1.28*   Hgb A1c No results for input(s): HGBA1C in the last 72 hours. Lipid Profile No results for input(s): CHOL, HDL, LDLCALC, TRIG, CHOLHDL, LDLDIRECT in the last 72 hours. Thyroid function studies No results for input(s): TSH, T4TOTAL, T3FREE, THYROIDAB in the last 72 hours.  Invalid input(s): FREET3 Anemia work up [employer]    08/22/19 0542  FERRITIN 369*   Urinalysis No results found for: COLORURINE, APPEARANCEUR, LABSPEC, PHURINE, GLUCOSEU, HGBUR,  BILIRUBINUR, KETONESUR, PROTEINUR, UROBILINOGEN, NITRITE, LEUKOCYTESUR Sepsis Labs Invalid input(s): PROCALCITONIN,  WBC,  LACTICIDVEN Microbiology Recent Results (from the past 240 hour(s))  Blood Culture (routine x 2)     Status: None   Collection Time: 08/16/19  6:27 PM   Specimen: BLOOD RIGHT ARM  Result Value Ref Range Status   Specimen Description BLOOD RIGHT ARM  Final   Special Requests   Final    BOTTLES DRAWN AEROBIC AND ANAEROBIC Blood Culture results may not be optimal due to an excessive volume of blood received in culture bottles   Culture  Final    NO GROWTH 5 DAYS Performed at [facility], [address], [place], [place] [address]    Report Status 08/21/2019 FINAL  Final  Blood Culture (routine x 2)     Status: None   Collection Time: 08/16/19  9:04 PM   Specimen: Left Antecubital; Blood  Result Value Ref Range Status   Specimen Description LEFT ANTECUBITAL  Final   Special Requests   Final    BOTTLES DRAWN AEROBIC AND ANAEROBIC Blood Culture adequate volume   Culture   Final    NO GROWTH 5 DAYS Performed at [facility], [address], [place], [place] [address]    Report Status 08/21/2019 FINAL  Final     Time coordinating discharge: 45 minutes  SIGNED:   Coralie Keens, MD  [facility] 08/22/2019, 1:19 PM

## 2019-08-26 ENCOUNTER — Telehealth: Payer: Self-pay | Admitting: *Deleted

## 2019-08-26 NOTE — Telephone Encounter (Signed)
Spoke with patient's nurse at [facility] due to missed transmission on 08/25/19. Patient does not have his monitor with him at the facility. Currently receiving care at [facility] due to [place] diagnosis, expected to be there for at least 10 days, then planning to go back to [facility] Spoke with Terrence Dupont at [facility] agrees to have patient's monitor brought to him.  Attempted to call back to [facility] x2 to provide update. No answer, no option to LM.

## 2019-09-01 ENCOUNTER — Other Ambulatory Visit: Payer: Self-pay

## 2019-09-01 ENCOUNTER — Emergency Department (HOSPITAL_COMMUNITY): Payer: Medicare Other

## 2019-09-01 ENCOUNTER — Inpatient Hospital Stay (HOSPITAL_COMMUNITY)
Admission: EM | Admit: 2019-09-01 | Discharge: 2019-10-05 | DRG: 871 | Disposition: E | Payer: Medicare Other | Attending: Family Medicine | Admitting: Family Medicine

## 2019-09-01 ENCOUNTER — Encounter (HOSPITAL_COMMUNITY): Payer: Self-pay | Admitting: Emergency Medicine

## 2019-09-01 DIAGNOSIS — U071 COVID-19: Secondary | ICD-10-CM | POA: Diagnosis present

## 2019-09-01 DIAGNOSIS — I10 Essential (primary) hypertension: Secondary | ICD-10-CM | POA: Diagnosis present

## 2019-09-01 DIAGNOSIS — Y95 Nosocomial condition: Secondary | ICD-10-CM | POA: Diagnosis present

## 2019-09-01 DIAGNOSIS — Z79899 Other long term (current) drug therapy: Secondary | ICD-10-CM

## 2019-09-01 DIAGNOSIS — N179 Acute kidney failure, unspecified: Secondary | ICD-10-CM | POA: Diagnosis not present

## 2019-09-01 DIAGNOSIS — R0603 Acute respiratory distress: Secondary | ICD-10-CM | POA: Diagnosis not present

## 2019-09-01 DIAGNOSIS — I447 Left bundle-branch block, unspecified: Secondary | ICD-10-CM | POA: Diagnosis present

## 2019-09-01 DIAGNOSIS — Z515 Encounter for palliative care: Secondary | ICD-10-CM | POA: Diagnosis not present

## 2019-09-01 DIAGNOSIS — N1831 Chronic kidney disease, stage 3a: Secondary | ICD-10-CM | POA: Diagnosis not present

## 2019-09-01 DIAGNOSIS — E872 Acidosis, unspecified: Secondary | ICD-10-CM

## 2019-09-01 DIAGNOSIS — J181 Lobar pneumonia, unspecified organism: Secondary | ICD-10-CM

## 2019-09-01 DIAGNOSIS — R001 Bradycardia, unspecified: Secondary | ICD-10-CM | POA: Diagnosis not present

## 2019-09-01 DIAGNOSIS — Z66 Do not resuscitate: Secondary | ICD-10-CM | POA: Diagnosis not present

## 2019-09-01 DIAGNOSIS — D509 Iron deficiency anemia, unspecified: Secondary | ICD-10-CM | POA: Diagnosis not present

## 2019-09-01 DIAGNOSIS — I129 Hypertensive chronic kidney disease with stage 1 through stage 4 chronic kidney disease, or unspecified chronic kidney disease: Secondary | ICD-10-CM | POA: Diagnosis not present

## 2019-09-01 DIAGNOSIS — G2581 Restless legs syndrome: Secondary | ICD-10-CM | POA: Diagnosis not present

## 2019-09-01 DIAGNOSIS — A4189 Other specified sepsis: Secondary | ICD-10-CM | POA: Diagnosis present

## 2019-09-01 DIAGNOSIS — J1289 Other viral pneumonia: Secondary | ICD-10-CM | POA: Diagnosis not present

## 2019-09-01 DIAGNOSIS — G9341 Metabolic encephalopathy: Secondary | ICD-10-CM | POA: Diagnosis present

## 2019-09-01 DIAGNOSIS — E785 Hyperlipidemia, unspecified: Secondary | ICD-10-CM

## 2019-09-01 DIAGNOSIS — J9601 Acute respiratory failure with hypoxia: Secondary | ICD-10-CM

## 2019-09-01 DIAGNOSIS — T17990A Other foreign object in respiratory tract, part unspecified in causing asphyxiation, initial encounter: Secondary | ICD-10-CM | POA: Diagnosis not present

## 2019-09-01 DIAGNOSIS — R310 Gross hematuria: Secondary | ICD-10-CM | POA: Diagnosis not present

## 2019-09-01 DIAGNOSIS — E871 Hypo-osmolality and hyponatremia: Secondary | ICD-10-CM

## 2019-09-01 DIAGNOSIS — R0902 Hypoxemia: Secondary | ICD-10-CM

## 2019-09-01 DIAGNOSIS — I159 Secondary hypertension, unspecified: Secondary | ICD-10-CM | POA: Diagnosis not present

## 2019-09-01 DIAGNOSIS — Z950 Presence of cardiac pacemaker: Secondary | ICD-10-CM | POA: Diagnosis not present

## 2019-09-01 DIAGNOSIS — E876 Hypokalemia: Secondary | ICD-10-CM | POA: Diagnosis not present

## 2019-09-01 DIAGNOSIS — A419 Sepsis, unspecified organism: Secondary | ICD-10-CM | POA: Diagnosis present

## 2019-09-01 DIAGNOSIS — J9621 Acute and chronic respiratory failure with hypoxia: Secondary | ICD-10-CM | POA: Diagnosis not present

## 2019-09-01 DIAGNOSIS — R6520 Severe sepsis without septic shock: Secondary | ICD-10-CM | POA: Diagnosis present

## 2019-09-01 DIAGNOSIS — R339 Retention of urine, unspecified: Secondary | ICD-10-CM | POA: Diagnosis not present

## 2019-09-01 DIAGNOSIS — J1282 Pneumonia due to coronavirus disease 2019: Secondary | ICD-10-CM | POA: Diagnosis present

## 2019-09-01 LAB — COMPREHENSIVE METABOLIC PANEL
ALT: 110 U/L — ABNORMAL HIGH (ref 0–44)
AST: 63 U/L — ABNORMAL HIGH (ref 15–41)
Albumin: 2.4 g/dL — ABNORMAL LOW (ref 3.5–5.0)
Alkaline Phosphatase: 88 U/L (ref 38–126)
Anion gap: 17 — ABNORMAL HIGH (ref 5–15)
BUN: 86 mg/dL — ABNORMAL HIGH (ref 8–23)
CO2: 15 mmol/L — ABNORMAL LOW (ref 22–32)
Calcium: 8.7 mg/dL — ABNORMAL LOW (ref 8.9–10.3)
Chloride: 95 mmol/L — ABNORMAL LOW (ref 98–111)
Creatinine, Ser: 2.74 mg/dL — ABNORMAL HIGH (ref 0.61–1.24)
GFR calc Af Amer: 21 mL/min — ABNORMAL LOW (ref >60)
GFR calc non Af Amer: 18 mL/min — ABNORMAL LOW (ref >60)
Glucose, Bld: 199 mg/dL — ABNORMAL HIGH (ref 70–99)
Potassium: 4.3 mmol/L (ref 3.5–5.1)
Sodium: 127 mmol/L — ABNORMAL LOW (ref 135–145)
Total Bilirubin: 1.1 mg/dL (ref 0.3–1.2)
Total Protein: 6.1 g/dL — ABNORMAL LOW (ref 6.5–8.1)

## 2019-09-01 LAB — BLOOD GAS, ARTERIAL
Acid-base deficit: 9.4 mmol/L — ABNORMAL HIGH (ref 0.0–2.0)
Bicarbonate: 17.8 mmol/L — ABNORMAL LOW (ref 20.0–28.0)
FIO2: 21
O2 Saturation: 89 %
Patient temperature: 36.4
pCO2 arterial: 24.1 mmHg — ABNORMAL LOW (ref 32.0–48.0)
pH, Arterial: 7.397 (ref 7.350–7.450)
pO2, Arterial: 64.7 mmHg — ABNORMAL LOW (ref 83.0–108.0)

## 2019-09-01 LAB — CBC WITH DIFFERENTIAL/PLATELET
Abs Immature Granulocytes: 0.15 10*3/uL — ABNORMAL HIGH (ref 0.00–0.07)
Basophils Absolute: 0 10*3/uL (ref 0.0–0.1)
Basophils Relative: 0 %
Eosinophils Absolute: 0 10*3/uL (ref 0.0–0.5)
Eosinophils Relative: 0 %
HCT: 25.7 % — ABNORMAL LOW (ref 39.0–52.0)
Hemoglobin: 8.4 g/dL — ABNORMAL LOW (ref 13.0–17.0)
Immature Granulocytes: 1 %
Lymphocytes Relative: 3 %
Lymphs Abs: 0.3 10*3/uL — ABNORMAL LOW (ref 0.7–4.0)
MCH: 28.9 pg (ref 26.0–34.0)
MCHC: 32.7 g/dL (ref 30.0–36.0)
MCV: 88.3 fL (ref 80.0–100.0)
Monocytes Absolute: 0.5 10*3/uL (ref 0.1–1.0)
Monocytes Relative: 4 %
Neutro Abs: 12.1 10*3/uL — ABNORMAL HIGH (ref 1.7–7.7)
Neutrophils Relative %: 92 %
Platelets: 259 10*3/uL (ref 150–400)
RBC: 2.91 MIL/uL — ABNORMAL LOW (ref 4.22–5.81)
RDW: 14.7 % (ref 11.5–15.5)
WBC: 13.1 10*3/uL — ABNORMAL HIGH (ref 4.0–10.5)
nRBC: 0 % (ref 0.0–0.2)

## 2019-09-01 LAB — URINALYSIS, ROUTINE W REFLEX MICROSCOPIC
Bacteria, UA: NONE SEEN
Bilirubin Urine: NEGATIVE
Glucose, UA: NEGATIVE mg/dL
Ketones, ur: NEGATIVE mg/dL
Leukocytes,Ua: NEGATIVE
Nitrite: NEGATIVE
Protein, ur: NEGATIVE mg/dL
Specific Gravity, Urine: 1.015 (ref 1.005–1.030)
pH: 5 (ref 5.0–8.0)

## 2019-09-01 LAB — LACTIC ACID, PLASMA
Lactic Acid, Venous: 3.5 mmol/L (ref 0.5–1.9)
Lactic Acid, Venous: 3.5 mmol/L (ref 0.5–1.9)
Lactic Acid, Venous: 2.4 mmol/L (ref 0.5–1.9)

## 2019-09-01 LAB — PROTIME-INR
INR: 1.3 — ABNORMAL HIGH (ref 0.8–1.2)
Prothrombin Time: 16 seconds — ABNORMAL HIGH (ref 11.4–15.2)

## 2019-09-01 LAB — APTT: aPTT: 35 seconds (ref 24–36)

## 2019-09-01 MED ORDER — VANCOMYCIN HCL 750 MG/150ML IV SOLN
750.0000 mg | INTRAVENOUS | Status: DC
  Filled 2019-09-01: qty 150

## 2019-09-01 MED ORDER — HYDROCORTISONE NA SUCCINATE PF 100 MG IJ SOLR
50.0000 mg | Freq: Four times a day (QID) | INTRAMUSCULAR | Status: DC
  Administered 2019-09-01 – 2019-09-02 (×3): 50 mg via INTRAVENOUS
  Filled 2019-09-01 (×3): qty 2

## 2019-09-01 MED ORDER — ACETAMINOPHEN 650 MG RE SUPP: 650.0000 mg | Freq: Four times a day (QID) | RECTAL | Status: DC | PRN

## 2019-09-01 MED ORDER — SODIUM CHLORIDE 0.9 % IV SOLN
2.0000 g | Freq: Once | INTRAVENOUS | Status: AC
  Administered 2019-09-01: 2 g via INTRAVENOUS
  Filled 2019-09-01: qty 2

## 2019-09-01 MED ORDER — ONDANSETRON HCL 4 MG PO TABS: 4.0000 mg | ORAL_TABLET | Freq: Four times a day (QID) | ORAL | Status: DC | PRN

## 2019-09-01 MED ORDER — SODIUM CHLORIDE 0.9 % IV BOLUS (SEPSIS)
1000.0000 mL | Freq: Once | INTRAVENOUS | Status: AC
  Administered 2019-09-01: 1000 mL via INTRAVENOUS

## 2019-09-01 MED ORDER — VANCOMYCIN HCL 1500 MG/300ML IV SOLN
1500.0000 mg | Freq: Once | INTRAVENOUS | Status: AC
  Administered 2019-09-01: 1500 mg via INTRAVENOUS
  Filled 2019-09-01: qty 300

## 2019-09-01 MED ORDER — ACETAMINOPHEN 325 MG PO TABS: 650.0000 mg | ORAL_TABLET | Freq: Four times a day (QID) | ORAL | Status: DC | PRN

## 2019-09-01 MED ORDER — ROPINIROLE HCL 0.25 MG PO TABS
0.2500 mg | ORAL_TABLET | Freq: Every day | ORAL | Status: DC
  Administered 2019-09-01 – 2019-09-04 (×2): 0.25 mg via ORAL
  Filled 2019-09-01 (×6): qty 1

## 2019-09-01 MED ORDER — GUAIFENESIN-DM 100-10 MG/5ML PO SYRP: 10.0000 mL | ORAL_SOLUTION | Freq: Four times a day (QID) | ORAL | Status: DC | PRN

## 2019-09-01 MED ORDER — VANCOMYCIN HCL IN DEXTROSE 1-5 GM/200ML-% IV SOLN: 1000.0000 mg | Freq: Once | INTRAVENOUS | Status: DC

## 2019-09-01 MED ORDER — ONDANSETRON HCL 4 MG/2ML IJ SOLN: 4.0000 mg | Freq: Four times a day (QID) | INTRAMUSCULAR | Status: DC | PRN

## 2019-09-01 MED ORDER — SODIUM CHLORIDE 0.9 % IV SOLN
2.0000 g | Freq: Two times a day (BID) | INTRAVENOUS | Status: DC
  Administered 2019-09-02: 2 g via INTRAVENOUS
  Filled 2019-09-01: qty 2

## 2019-09-01 MED ORDER — ENOXAPARIN SODIUM 40 MG/0.4ML ~~LOC~~ SOLN: 40.0000 mg | SUBCUTANEOUS | Status: DC

## 2019-09-01 MED ORDER — HEPARIN SODIUM (PORCINE) 5000 UNIT/ML IJ SOLN
5000.0000 [IU] | Freq: Three times a day (TID) | INTRAMUSCULAR | Status: DC
  Administered 2019-09-01 – 2019-09-02 (×3): 5000 [IU] via SUBCUTANEOUS
  Filled 2019-09-01 (×3): qty 1

## 2019-09-01 MED ORDER — SODIUM CHLORIDE 0.9 % IV BOLUS
1000.0000 mL | Freq: Once | INTRAVENOUS | Status: AC
  Administered 2019-09-01: 1000 mL via INTRAVENOUS

## 2019-09-01 MED ORDER — ATORVASTATIN CALCIUM 10 MG PO TABS
20.0000 mg | ORAL_TABLET | Freq: Every day | ORAL | Status: DC
  Administered 2019-09-01: 20 mg via ORAL
  Filled 2019-09-01 (×2): qty 2

## 2019-09-01 MED ORDER — SODIUM CHLORIDE 0.9 % IV SOLN: INTRAVENOUS | Status: DC

## 2019-09-01 NOTE — ED Provider Notes (Signed)
[facility] EMERGENCY DEPARTMENT Provider Note   CSN: [id number] Arrival date & time: 08/04/2019  1514     History Chief Complaint  Patient presents with  . Shortness of Breath    Tom Chavez is a 85 y.o. male.  HPI   This patient is an ill-appearing 85 year old male with a known history of hypertension, stage III chronic kidney disease and a recent admission to the hospital for 6 days after being diagnosed with coronavirus pneumonia.  The patient has reportedly been at the [facility] on 3 L by nasal cannula since discharge and today was found to be increasingly weak, confused, hypoxic with oxygen around 74% on his 3 L.  He was transported to the hospital in this condition, he has confused and altered unable to give me any further information thus a level 5 caveat applies.  Paramedics endorsed a hypoxia there has been no other abnormal vital signs prehospital.  He was noted to be hypothermic on arrival.  Past Medical History:  Diagnosis Date  . Body mass index 26.0-26.9, adult   . Decreased hearing   . Hypercholesterolemia   . Hypertension   . Restless leg   . Swelling of both lower extremities   . Symptomatic bradycardia    implanted at [facility])  . Unsteady gait     Patient Active Problem List   Diagnosis Date Noted  . Sepsis (HCC) 08/27/2019  . HTN (hypertension) 08/21/2019  . CKD (chronic kidney disease), stage III 08/21/2019  . Dyslipidemia 08/21/2019  . Restless leg syndrome 08/21/2019  . Pneumonia due to COVID-19 virus 08/16/2019    Past Surgical History:  Procedure Laterality Date  . PACEMAKER IMPLANT     Medtronic PPM implanted by Dr Judd Gaudier at [facility], [place]  . TOTAL HIP ARTHROPLASTY Right 2000       History reviewed. No pertinent family history.  Social History   Tobacco Use  . Smoking status: Never Smoker  . Smokeless tobacco: Never Used  Substance Use Topics  . Alcohol use: Never  . Drug use: Never    Home  Medications Prior to Admission medications   Medication Sig Start Date End Date Taking? Authorizing Provider  amLODipine (NORVASC) 10 MG tablet Take 10 mg by mouth daily. 08/11/19   [provider]  atorvastatin (LIPITOR) 20 MG tablet Take 20 mg by mouth daily.    [provider]  enoxaparin (LOVENOX) 40 MG/0.4ML injection Inject 0.4 mLs (40 mg total) into the skin daily. 08/23/19 09/22/19  Arrien, York Ram, MD  guaiFENesin-dextromethorphan (ROBITUSSIN DM) 100-10 MG/5ML syrup Take 10 mLs by mouth every 6 (six) hours as needed for cough. 08/22/19   Arrien, York Ram, MD  rOPINIRole (REQUIP) 0.25 MG tablet Take 0.25 mg by mouth at bedtime.    [provider]  SIMBRINZA 1-0.2 % SUSP Place 1 drop into both eyes 3 (three) times daily. 07/13/19   [provider]  valsartan-hydrochlorothiazide (DIOVAN-HCT) 160-25 MG tablet Take 1 tablet by mouth daily.    [provider]    Allergies    Patient has no known allergies.  Review of Systems   Review of Systems  Unable to perform ROS: Mental status change    Physical Exam Updated Vital Signs BP (!) 100/40   Pulse 68   Resp (!) 23   Ht 1.778 m (5\' 10" )   Wt 65.8 kg   SpO2 96%   BMI 20.81 kg/m   Physical Exam Vitals and nursing note reviewed.  Constitutional:      Appearance: He is well-developed. He is ill-appearing.  HENT:     Head: Normocephalic and atraumatic.     Mouth/Throat:     Mouth: Mucous membranes are dry.     Pharynx: No oropharyngeal exudate.  Eyes:     General: No scleral icterus.       Right eye: No discharge.        Left eye: No discharge.     Conjunctiva/sclera: Conjunctivae normal.     Pupils: Pupils are equal, round, and reactive to light.  Neck:     Thyroid: No thyromegaly.     Vascular: No JVD.  Cardiovascular:     Rate and Rhythm: Normal rate and regular rhythm.     Heart sounds: Normal heart sounds. No murmur. No friction rub. No gallop.   Pulmonary:       Effort: Respiratory distress present.     Breath sounds: Rales present. No wheezing.     Comments: There is increased work of breathing, tachypnea, rales at the bases Abdominal:     General: Bowel sounds are normal. There is no distension.     Palpations: Abdomen is soft. There is no mass.     Tenderness: There is no abdominal tenderness.  Musculoskeletal:        General: No tenderness. Normal range of motion.     Cervical back: Normal range of motion and neck supple.     Comments: Scant bilateral symmetrical pitting edema to the legs  Lymphadenopathy:     Cervical: No cervical adenopathy.  Skin:    General: Skin is warm and dry.     Findings: No erythema or rash.     Comments: Pale and dry  Neurological:     Mental Status: He is alert.     Coordination: Coordination normal.     Comments: The patient is able to squeeze adequately with both hands, lift his right leg, has difficulty lifting his left leg states that is chronic.  Cranial nerves III through XII appear to be normal, his speech is somewhat difficult sometimes answering questions appropriately and sometimes not but no facial droop.  Psychiatric:        Behavior: Behavior normal.     ED Results / Procedures / Treatments   Labs (all labs ordered are listed, but only abnormal results are displayed) Labs Reviewed  LACTIC ACID, PLASMA - Abnormal; Notable for the following components:      Result Value   Lactic Acid, Venous 3.5 (*)    All other components within normal limits  LACTIC ACID, PLASMA - Abnormal; Notable for the following components:   Lactic Acid, Venous 3.5 (*)    All other components within normal limits  COMPREHENSIVE METABOLIC PANEL - Abnormal; Notable for the following components:   Sodium 127 (*)    Chloride 95 (*)    CO2 15 (*)    Glucose, Bld 199 (*)    BUN 86 (*)    Creatinine, Ser 2.74 (*)    Calcium 8.7 (*)    Total Protein 6.1 (*)    Albumin 2.4 (*)    AST 63 (*)    ALT 110 (*)    GFR  calc non Af Amer 18 (*)    GFR calc Af Amer 21 (*)    Anion gap 17 (*)    All other components within normal limits  CBC WITH DIFFERENTIAL/PLATELET - Abnormal; Notable for the following components:   WBC 13.1 (*)  RBC 2.91 (*)    Hemoglobin 8.4 (*)    HCT 25.7 (*)    Neutro Abs 12.1 (*)    Lymphs Abs 0.3 (*)    Abs Immature Granulocytes 0.15 (*)    All other components within normal limits  PROTIME-INR - Abnormal; Notable for the following components:   Prothrombin Time 16.0 (*)    INR 1.3 (*)    All other components within normal limits  URINALYSIS, ROUTINE W REFLEX MICROSCOPIC - Abnormal; Notable for the following components:   Hgb urine dipstick SMALL (*)    All other components within normal limits  BLOOD GAS, ARTERIAL - Abnormal; Notable for the following components:   pCO2 arterial 24.1 (*)    pO2, Arterial 64.7 (*)    Bicarbonate 17.8 (*)    Acid-base deficit 9.4 (*)    All other components within normal limits  CULTURE, BLOOD (ROUTINE X 2)  CULTURE, BLOOD (ROUTINE X 2)  URINE CULTURE  MRSA PCR SCREENING  APTT  CREATININE, SERUM  LACTIC ACID, PLASMA    EKG EKG Interpretation  Date/Time:  09/07/2019 15:34:40 EST Ventricular Rate:  73 PR Interval:    QRS Duration: 172 QT Interval:  496 QTC Calculation: 547 R Axis:   -43 Text Interpretation: Sinus rhythm Short PR interval Left bundle branch block since last tracing no significant change Confirmed by Noemi Chapel [phone]) on 09/07/19 5:23:15 PM   Radiology DG Chest Port 1 View  Result Date: 09/07/2019 CLINICAL DATA:  Cough, recent COVID, hypoxia. EXAM: PORTABLE CHEST 1 VIEW COMPARISON:  Chest radiograph 08/16/2011. FINDINGS: Redemonstrated left chest dual lead pacer device. Heart size at the upper limits of normal. Aortic atherosclerosis. Again demonstrated is bilateral interstitial prominence and subtle ill-defined bilateral patchy pulmonary opacities. Likely scarring within the right mid to  upper lung field. No evidence of pleural effusion or pneumothorax. No acute bony abnormality. IMPRESSION: Interstitial prominence with subtle bilateral patchy ill-defined pulmonary opacities. Findings are similar to prior examination 08/16/2019 and suspicious for atypical/viral pneumonia given provided history. Probable scarring within the right mid to upper lung. Heart size at the upper limits of normal. Electronically Signed   By: Kellie Simmering DO   On: 09/07/19 15:59    Procedures .Critical Care Performed by: Noemi Chapel, MD Authorized by: Noemi Chapel, MD   Critical care provider statement:    Critical care time (minutes):  35   Critical care time was exclusive of:  Separately billable procedures and treating other patients and teaching time   Critical care was necessary to treat or prevent imminent or life-threatening deterioration of the following conditions:  Respiratory failure   Critical care was time spent personally by me on the following activities:  Blood draw for specimens, development of treatment plan with patient or surrogate, discussions with consultants, evaluation of patient's response to treatment, examination of patient, obtaining history from patient or surrogate, ordering and performing treatments and interventions, ordering and review of laboratory studies, ordering and review of radiographic studies, pulse oximetry, re-evaluation of patient's condition and review of old charts   (including critical care time)  Medications Ordered in ED Medications  ceFEPIme (MAXIPIME) 2 g in sodium chloride 0.9 % 100 mL IVPB (has no administration in time range)  vancomycin (VANCOREADY) IVPB 750 mg/150 mL (has no administration in time range)  ceFEPIme (MAXIPIME) 2 g in sodium chloride 0.9 % 100 mL IVPB (0 g Intravenous Stopped 09/07/19 1634)  vancomycin (VANCOREADY) IVPB 1500 mg/300 mL (1,500 mg Intravenous  New Bag/Given 09-30-2019 1606)  sodium chloride 0.9 % bolus 1,000 mL (1,000  mLs Intravenous New Bag/Given 09-30-19 1822)    ED Course  I have reviewed the triage vital signs and the nursing notes.  Pertinent labs & imaging results that were available during my care of the patient were reviewed by me and considered in my medical decision making (see chart for details).    MDM Rules/Calculators/A&P                      It is unclear what is causing the patient's symptoms but being hypothermic and hypoxic is concerning.  We will get an ABG on his 3 L to verify the true oxygen level as he does appear cold and is not perfusing well based on a slightly delayed capillary refill.  The patient has ill-appearing and critically ill  Important labs of note show an ABG showing hypoxia, lactic acid of 3.5, hyponatremia, acidemia with a CO2 of 15, creatinine of 2.74 which is new and a significant acute kidney injury and acute renal failure.  He has a slight increase in his liver function tests and a decrease in his albumin.  Leukocytosis of 13,000 and a hemoglobin of 8.4,  The chest x-ray shows infiltrates consistent with prior x-ray patchy and ill-defined.  I am concerned that this patient is septic either from pneumonia or some other source.  He is borderline hypotensive and given his elevated lactic acid he at the minimum has severe sepsis.  1 L of IV fluids will be given, antibiotics have been given broad-spectrum, will discuss with hospitalist for admission.  The patient is critically ill  I discussed the care with Dr. Konrad Dolores who will admit  Tom Chavez was evaluated in Emergency Department on 09-30-19 for the symptoms described in the history of present illness. He was evaluated in the context of the global COVID-19 pandemic, which necessitated consideration that the patient might be at risk for infection with the SARS-CoV-2 virus that causes COVID-19. Institutional protocols and algorithms that pertain to the evaluation of patients at risk for COVID-19 are in a state of  rapid change based on information released by regulatory bodies including the CDC and federal and state organizations. These policies and algorithms were followed during the patient's care in the ED.   Final Clinical Impression(s) / ED Diagnoses Final diagnoses:  Severe sepsis (HCC)  Acute renal failure, unspecified acute renal failure type (HCC)  Pneumonia due to COVID-19 virus  Acute respiratory failure with hypoxia (HCC)      Eber Hong, MD 09/30/19 1840

## 2019-09-01 NOTE — Progress Notes (Signed)
Notified provider and bedside nurse of need to order fluid bolus.  Secure chat sent to MD and RN asking for fluid recommendation to be ordered for 2 maps less than 65

## 2019-09-01 NOTE — H&P (Signed)
History and Physical    Tom Chavez XMI:[id number] DOB: 11-Mar-1919 DOA: 08/24/2019  PCP: Monico Blitz, MD Patient coming from: [facility]    Chief Complaint: Hypotension and confusion  Level 5 Caveat due to Acute encephalopathy.   HPI: Tom Chavez is a 85 y.o. male with a medical history significant of HLD, HTN, Bradycardia w/ pacemaker presenting after reports at [facility] of Increasing confusion, hypoxemia and confusion and weakness. O2 sats dropped to 74% on 3L pri0or to admissoin. Of note pt was admitted on 08/16/19 w/ COVID pneumonia. Admitted to [facility] and DC to [facility] on 3L [place] at baseline. Pt w/o active complaints at time of my examination though minimally ocnversive.   ED Course: Sepsis protocol initiated. Objective findings below.   Review of Systems: As per HPI otherwise all other systems reviewed and are negative  Ambulatory Status: unclear.   Past Medical History:  Diagnosis Date  . Body mass index 26.0-26.9, adult   . Decreased hearing   . Hypercholesterolemia   . Hypertension   . Restless leg   . Swelling of both lower extremities   . Symptomatic bradycardia    implanted at [facility])  . Unsteady gait     Past Surgical History:  Procedure Laterality Date  . PACEMAKER IMPLANT     Medtronic PPM implanted by Dr Hinton Dyer at [facility], [place]  . TOTAL HIP ARTHROPLASTY Right 2000    Social History   Socioeconomic History  . Marital status: Married    Spouse name: Not on file  . Number of children: Not on file  . Years of education: Not on file  . Highest education level: Not on file  Occupational History  . Not on file  Tobacco Use  . Smoking status: Never Smoker  . Smokeless tobacco: Never Used  Substance and Sexual Activity  . Alcohol use: Never  . Drug use: Never  . Sexual activity: Not on file  Other Topics Concern  . Not on file  Social History Narrative   Lives in independent living in [place].   Social  Determinants of Health   Financial Resource Strain:   . Difficulty of Paying Living Expenses: Not on file  Food Insecurity:   . Worried About Charity fundraiser in the Last Year: Not on file  . Ran Out of Food in the Last Year: Not on file  Transportation Needs:   . Lack of Transportation (Medical): Not on file  . Lack of Transportation (Non-Medical): Not on file  Physical Activity:   . Days of Exercise per Week: Not on file  . Minutes of Exercise per Session: Not on file  Stress:   . Feeling of Stress : Not on file  Social Connections:   . Frequency of Communication with Friends and Family: Not on file  . Frequency of Social Gatherings with Friends and Family: Not on file  . Attends Religious Services: Not on file  . Active Member of Clubs or Organizations: Not on file  . Attends Archivist Meetings: Not on file  . Marital Status: Not on file  Intimate Partner Violence:   . Fear of Current or Ex-Partner: Not on file  . Emotionally Abused: Not on file  . Physically Abused: Not on file  . Sexually Abused: Not on file    No Known Allergies  History reviewed. No pertinent family history.  - Unable to obtain due to mental status.   Prior to Admission medications  Medication Sig Start Date End Date Taking? Authorizing Provider  amLODipine (NORVASC) 10 MG tablet Take 10 mg by mouth daily. 08/11/19   [provider]  atorvastatin (LIPITOR) 20 MG tablet Take 20 mg by mouth daily.    [provider]  enoxaparin (LOVENOX) 40 MG/0.4ML injection Inject 0.4 mLs (40 mg total) into the skin daily. 08/23/19 09/22/19  Arrien, Jimmy Picket, MD  guaiFENesin-dextromethorphan (ROBITUSSIN DM) 100-10 MG/5ML syrup Take 10 mLs by mouth every 6 (six) hours as needed for cough. 08/22/19   Arrien, Jimmy Picket, MD  rOPINIRole (REQUIP) 0.25 MG tablet Take 0.25 mg by mouth at bedtime.    [provider]  SIMBRINZA 1-0.2 % SUSP Place 1 drop into both eyes 3 (three)  times daily. 07/13/19   [provider]  valsartan-hydrochlorothiazide (DIOVAN-HCT) 160-25 MG tablet Take 1 tablet by mouth daily.    [provider]    Physical Exam: Vitals:   08/20/2019 1800 08/20/2019 1815 08/31/2019 1830 08/28/2019 1845  BP: (!) 83/48 (!) 100/40 (!) 99/42 (!) 111/52  Pulse: 60 68 72 69  Resp: 19 (!) 23 (!) 34 (!) 32  SpO2: (!) 79% 96% 99% 93%  Weight:      Height:         General: ELderly and frail appearing. Ill  Eyes:  PERRL, EOMI, normal lids, iris ENT: dry mm, diminished hearing.  Neck:  no LAD, masses or thyromegaly Cardiovascular:  RRR, no m/r/g. 1+LE edema.  Respiratory: Diminished breath sounds throughout w/ increased effort. Ronchi intermittently heard.  Abdomen:  soft, ntnd, NABS Skin:  Decreased overall turgor. no rash or induration seen on limited exam Musculoskeletal: Weak muscle tone but symmetrical. no bony abnormality Psychiatric: AOx1, follows basic commands Neurologic:  CN 2-12 grossly intact, sensation intact  Labs on Admission: I have personally reviewed following labs and imaging studies  CBC: Recent Labs  Lab 09/02/2019 1549  WBC 13.1*  NEUTROABS 12.1*  HGB 8.4*  HCT 25.7*  MCV 88.3  PLT 237   Basic Metabolic Panel: Recent Labs  Lab 08/06/2019 1549  NA 127*  K 4.3  CL 95*  CO2 15*  GLUCOSE 199*  BUN 86*  CREATININE 2.74*  CALCIUM 8.7*   GFR: Estimated Creatinine Clearance: 14 mL/min (A) (by C-G formula based on SCr of 2.74 mg/dL (H)). Liver Function Tests: Recent Labs  Lab 08/17/2019 1549  AST 63*  ALT 110*  ALKPHOS 88  BILITOT 1.1  PROT 6.1*  ALBUMIN 2.4*   No results for input(s): LIPASE, AMYLASE in the last 168 hours. No results for input(s): AMMONIA in the last 168 hours. Coagulation Profile: Recent Labs  Lab 08/08/2019 1549  INR 1.3*   Cardiac Enzymes: No results for input(s): CKTOTAL, CKMB, CKMBINDEX, TROPONINI in the last 168 hours. BNP (last 3 results) No results for input(s): PROBNP in  the last 8760 hours. HbA1C: No results for input(s): HGBA1C in the last 72 hours. CBG: No results for input(s): GLUCAP in the last 168 hours. Lipid Profile: No results for input(s): CHOL, HDL, LDLCALC, TRIG, CHOLHDL, LDLDIRECT in the last 72 hours. Thyroid Function Tests: No results for input(s): TSH, T4TOTAL, FREET4, T3FREE, THYROIDAB in the last 72 hours. Anemia Panel: No results for input(s): VITAMINB12, FOLATE, FERRITIN, TIBC, IRON, RETICCTPCT in the last 72 hours. Urine analysis:    Component Value Date/Time   COLORURINE YELLOW 08/25/2019 [place] 09/02/2019 1645   LABSPEC 1.015 08/22/2019 1645   PHURINE 5.0 08/08/2019 1645   GLUCOSEU NEGATIVE  08/12/2019 1645   HGBUR SMALL (A) 08/08/2019 [place] 08/24/2019 [place] 09/03/2019 1645   PROTEINUR NEGATIVE 09/03/2019 1645   NITRITE NEGATIVE 08/18/2019 1645   LEUKOCYTESUR NEGATIVE 08/28/2019 1645    Creatinine Clearance: Estimated Creatinine Clearance: 14 mL/min (A) (by C-G formula based on SCr of 2.74 mg/dL (H)).  Sepsis Labs: '@LABRCNTIP'$ (procalcitonin:4,lacticidven:4) )No results found for this or any previous visit (from the past 240 hour(s)).   Radiological Exams on Admission: DG Chest Port 1 View  Result Date: 08/24/2019 CLINICAL DATA:  Cough, recent COVID, hypoxia. EXAM: PORTABLE CHEST 1 VIEW COMPARISON:  Chest radiograph 08/16/2011. FINDINGS: Redemonstrated left chest dual lead pacer device. Heart size at the upper limits of normal. Aortic atherosclerosis. Again demonstrated is bilateral interstitial prominence and subtle ill-defined bilateral patchy pulmonary opacities. Likely scarring within the right mid to upper lung field. No evidence of pleural effusion or pneumothorax. No acute bony abnormality. IMPRESSION: Interstitial prominence with subtle bilateral patchy ill-defined pulmonary opacities. Findings are similar to prior examination 08/16/2019 and suspicious for  atypical/viral pneumonia given provided history. Probable scarring within the right mid to upper lung. Heart size at the upper limits of normal. Electronically Signed   By: Kellie Simmering DO   On: 08/12/2019 15:59    EKG: Independently reviewed. LBBB, Sinus, No ACS - similar to previous.   Assessment/Plan Active Problems:   HTN (hypertension)   HLD (hyperlipidemia)   Restless leg syndrome   Sepsis (HCC)   LBBB (left bundle branch block)   AKI (acute kidney injury) ([place])   Hyponatremia   Bradycardia   Acute on chronic respiratory failure with hypoxia (HCC)   Metabolic acidosis     Sepsis: likely secondary to poor physical reserve secondary to age and post COVID pneumonia. Sepsis criteria met and pt started on sepsis protocol.  - ICU admission - Sepsis orderset used - Cef and Vanc - F/u BCX - IVF fluid bolus - has not received.  - Trend Lactic acid  - Solu-Cortef.  - Continue Lovenox secondary to recent COVID dx and pt at high risk for DVT.   Acute respiratory failure: likely secondary to COVID. Pt now on day 17 since COVID dx.  - O2 PRN - Solucortef as above - treatment of pneumonia as above  HTN: hypotensive on admission - IVF - Hold home medications  AKI: Cr 2.74. baseline 1.1. Secondary to Sepsis and hypoperfusion. - IVF - BMP in am  Hyponatremia: likely from poor oral intake for days and use of diuretic - IVF replacement - BMP in am  HLD:  - continue statin  RLS - continue requip   DVT prophylaxis: Lovenox  Code Status: FULL - No family available and could not reach by phone  Family Communication: As above  Disposition Plan: Pending improvement in respiratory status  Consults called: None  Admission status: inpt.     Waldemar Dickens MD [facility]  If 7PM-7AM, please contact night-coverage [email or web address] Password [facility]  08/30/2019, 7:19 PM

## 2019-09-01 NOTE — ED Notes (Signed)
Date and time results received: 03-Sep-2019 10:36 PM(use smartphrase ".now" to insert current time)  Test: lactic acid Critical Value: 2.4  Name of Provider Notified: Dr Marily Memos   Orders Received? Or Actions Taken?: see chart.

## 2019-09-01 NOTE — ED Triage Notes (Signed)
[facility] called out to [facility] in [place] today for worsening in SOB. PT dx with Covid on 08/16/19 and is normally wearing 3L n/c oxygen but had oxygen sats of 74% on 3L today. PT denies any pain.

## 2019-09-01 NOTE — ED Notes (Signed)
CRITICAL VALUE ALERT  Critical Value:  Lactic 3.5  Date & Time Notied:  08/09/2019, 1620  Provider Notified: Dr. Sabra Heck  Orders Received/Actions taken: no new orders

## 2019-09-01 NOTE — ED Notes (Signed)
Called [facility] and put pt on covid transport list. Tom Chavez

## 2019-09-01 NOTE — Progress Notes (Signed)
Notified provider and bedside nurse of need to order repeat lactic acid.  Secure chat sent to MD and RN asking for a repeat lactate to be drawn since second reading had not decreased.

## 2019-09-01 NOTE — Progress Notes (Signed)
Pharmacy Antibiotic Note  Isaul Landi is a 85 y.o. male admitted on 08/29/2019 with pneumonia.  Pharmacy has been consulted for Vancomycin and Cefepime dosing.  Plan: Vancomycin 1500 mg IV x 1 dose. Vancomycin 750 mg IV every 24 hours.  Goal trough 15-20 mcg/mL.  Cefepime 2000 mg IV every 12 hours. Monitor labs, c/s, and vanco level as indicated.  Height: 5\' 10"  (177.8 cm) Weight: 145 lb 1 oz (65.8 kg) IBW/kg (Calculated) : 73  No data recorded.  No results for input(s): WBC, CREATININE, LATICACIDVEN, VANCOTROUGH, VANCOPEAK, VANCORANDOM, GENTTROUGH, GENTPEAK, GENTRANDOM, TOBRATROUGH, TOBRAPEAK, TOBRARND, AMIKACINPEAK, AMIKACINTROU, AMIKACIN in the last 168 hours.  Estimated Creatinine Clearance: 34.9 mL/min (by C-G formula based on SCr of 1.1 mg/dL).    No Known Allergies  Antimicrobials this admission: Vanco 12/29 >>  Cefepime 12/29 >>   Dose adjustments this admission: Vanco/Cefepime  Microbiology results: 12/29 BCx: pending 12/29 UCx: pending   12/29 MRSA PCR: pending  Thank you for allowing pharmacy to be a part of this patient's care.  Ramond Craver 08/16/2019 3:42 PM

## 2019-09-02 DIAGNOSIS — E871 Hypo-osmolality and hyponatremia: Secondary | ICD-10-CM

## 2019-09-02 DIAGNOSIS — J9621 Acute and chronic respiratory failure with hypoxia: Secondary | ICD-10-CM

## 2019-09-02 DIAGNOSIS — I447 Left bundle-branch block, unspecified: Secondary | ICD-10-CM

## 2019-09-02 DIAGNOSIS — J181 Lobar pneumonia, unspecified organism: Secondary | ICD-10-CM

## 2019-09-02 DIAGNOSIS — A419 Sepsis, unspecified organism: Secondary | ICD-10-CM

## 2019-09-02 DIAGNOSIS — N1831 Chronic kidney disease, stage 3a: Secondary | ICD-10-CM

## 2019-09-02 DIAGNOSIS — N179 Acute kidney failure, unspecified: Secondary | ICD-10-CM

## 2019-09-02 LAB — URINALYSIS, ROUTINE W REFLEX MICROSCOPIC
Bilirubin Urine: NEGATIVE
Glucose, UA: NEGATIVE mg/dL
Ketones, ur: NEGATIVE mg/dL
Leukocytes,Ua: NEGATIVE
Nitrite: NEGATIVE
Protein, ur: NEGATIVE mg/dL
Specific Gravity, Urine: 1.016 (ref 1.005–1.030)
pH: 5 (ref 5.0–8.0)

## 2019-09-02 LAB — CBC WITH DIFFERENTIAL/PLATELET
Abs Immature Granulocytes: 0.09 10*3/uL — ABNORMAL HIGH (ref 0.00–0.07)
Basophils Absolute: 0 10*3/uL (ref 0.0–0.1)
Basophils Relative: 0 %
Eosinophils Absolute: 0 10*3/uL (ref 0.0–0.5)
Eosinophils Relative: 0 %
HCT: 20.4 % — ABNORMAL LOW (ref 39.0–52.0)
Hemoglobin: 6.5 g/dL — CL (ref 13.0–17.0)
Immature Granulocytes: 1 %
Lymphocytes Relative: 3 %
Lymphs Abs: 0.2 10*3/uL — ABNORMAL LOW (ref 0.7–4.0)
MCH: 28.1 pg (ref 26.0–34.0)
MCHC: 31.9 g/dL (ref 30.0–36.0)
MCV: 88.3 fL (ref 80.0–100.0)
Monocytes Absolute: 0.2 10*3/uL (ref 0.1–1.0)
Monocytes Relative: 2 %
Neutro Abs: 8.7 10*3/uL — ABNORMAL HIGH (ref 1.7–7.7)
Neutrophils Relative %: 94 %
Platelets: 222 10*3/uL (ref 150–400)
RBC: 2.31 MIL/uL — ABNORMAL LOW (ref 4.22–5.81)
RDW: 15 % (ref 11.5–15.5)
WBC: 9.2 10*3/uL (ref 4.0–10.5)
nRBC: 0 % (ref 0.0–0.2)

## 2019-09-02 LAB — BASIC METABOLIC PANEL
Anion gap: 13 (ref 5–15)
BUN: 81 mg/dL — ABNORMAL HIGH (ref 8–23)
CO2: 14 mmol/L — ABNORMAL LOW (ref 22–32)
Calcium: 7.8 mg/dL — ABNORMAL LOW (ref 8.9–10.3)
Chloride: 104 mmol/L (ref 98–111)
Creatinine, Ser: 2.39 mg/dL — ABNORMAL HIGH (ref 0.61–1.24)
GFR calc Af Amer: 25 mL/min — ABNORMAL LOW (ref >60)
GFR calc non Af Amer: 22 mL/min — ABNORMAL LOW (ref >60)
Glucose, Bld: 122 mg/dL — ABNORMAL HIGH (ref 70–99)
Potassium: 3.4 mmol/L — ABNORMAL LOW (ref 3.5–5.1)
Sodium: 131 mmol/L — ABNORMAL LOW (ref 135–145)

## 2019-09-02 LAB — PROTIME-INR
INR: 1.5 — ABNORMAL HIGH (ref 0.8–1.2)
Prothrombin Time: 17.7 seconds — ABNORMAL HIGH (ref 11.4–15.2)

## 2019-09-02 LAB — STREP PNEUMONIAE URINARY ANTIGEN: Strep Pneumo Urinary Antigen: NEGATIVE

## 2019-09-02 LAB — PREPARE RBC (CROSSMATCH)

## 2019-09-02 LAB — URINE CULTURE: Culture: NO GROWTH

## 2019-09-02 LAB — HIV ANTIBODY (ROUTINE TESTING W REFLEX): HIV Screen 4th Generation wRfx: NONREACTIVE

## 2019-09-02 LAB — CORTISOL-AM, BLOOD: Cortisol - AM: >100 ug/dL — ABNORMAL HIGH (ref 6.7–22.6)

## 2019-09-02 LAB — PROCALCITONIN: Procalcitonin: 0.58 ng/mL

## 2019-09-02 MED ORDER — POTASSIUM CHLORIDE 20 MEQ PO PACK
40.0000 meq | PACK | Freq: Once | ORAL | Status: AC
  Administered 2019-09-02: 40 meq via ORAL
  Filled 2019-09-02: qty 2

## 2019-09-02 MED ORDER — STERILE WATER FOR INJECTION IV SOLN
INTRAVENOUS | Status: DC
  Filled 2019-09-02 (×3): qty 850

## 2019-09-02 MED ORDER — CHLORHEXIDINE GLUCONATE 0.12 % MT SOLN
15.0000 mL | Freq: Two times a day (BID) | OROMUCOSAL | Status: DC
  Administered 2019-09-02 – 2019-09-05 (×5): 15 mL via OROMUCOSAL
  Filled 2019-09-02 (×6): qty 15

## 2019-09-02 MED ORDER — SODIUM CHLORIDE 0.9% IV SOLUTION: Freq: Once | INTRAVENOUS | Status: AC

## 2019-09-02 MED ORDER — HYDROCORTISONE NA SUCCINATE PF 100 MG IJ SOLR
50.0000 mg | Freq: Two times a day (BID) | INTRAMUSCULAR | Status: DC
  Administered 2019-09-02 – 2019-09-03 (×2): 50 mg via INTRAVENOUS
  Filled 2019-09-02 (×2): qty 2

## 2019-09-02 MED ORDER — VANCOMYCIN HCL IN DEXTROSE 1-5 GM/200ML-% IV SOLN: 1000.0000 mg | INTRAVENOUS | Status: DC

## 2019-09-02 MED ORDER — CHLORHEXIDINE GLUCONATE CLOTH 2 % EX PADS
6.0000 | MEDICATED_PAD | Freq: Every day | CUTANEOUS | Status: DC
  Administered 2019-09-03 – 2019-09-05 (×4): 6 via TOPICAL

## 2019-09-02 MED ORDER — SODIUM CHLORIDE 0.9 % IV SOLN
2.0000 g | INTRAVENOUS | Status: DC
  Administered 2019-09-03 – 2019-09-06 (×4): 2 g via INTRAVENOUS
  Filled 2019-09-02 (×4): qty 2

## 2019-09-02 MED ORDER — ORAL CARE MOUTH RINSE
15.0000 mL | Freq: Two times a day (BID) | OROMUCOSAL | Status: DC
  Administered 2019-09-04 – 2019-09-05 (×3): 15 mL via OROMUCOSAL

## 2019-09-02 NOTE — Evaluation (Addendum)
Clinical/Bedside Swallow Evaluation Patient Details  Name: Tom Chavez MRN: [id number] Date of Birth: May 14, 1919  Today's Date: 09/02/2019 Time: SLP Start Time (ACUTE ONLY): 1446 SLP Stop Time (ACUTE ONLY): 1502 SLP Time Calculation (min) (ACUTE ONLY): 16 min  Past Medical History:  Past Medical History:  Diagnosis Date  . Body mass index 26.0-26.9, adult   . Decreased hearing   . Hypercholesterolemia   . Hypertension   . Restless leg   . Swelling of both lower extremities   . Symptomatic bradycardia    implanted at [facility])  . Unsteady gait    Past Surgical History:  Past Surgical History:  Procedure Laterality Date  . PACEMAKER IMPLANT     Medtronic PPM implanted by Dr Hinton Dyer at [facility] [place]  . TOTAL HIP ARTHROPLASTY Right 2000   HPI:  Tom Chavez is a 85 y.o. male with a medical history HLD, HTN, Bradycardia w/ pacemaker presenting after reports at [facility] of increasing confusion, hypoxemia and confusion and weakness. O2 sats dropped to 74% on 3L pri0or to admissoin. Of note pt was admitted on 08/16/19 w/ COVID pneumonia. Admitted to [facility] and DC to [facility] on 3L [place] at baseline.    Assessment / Plan / Recommendation Clinical Impression  Pt encountered sleeping, easily woken, increased work of breathing and appears to feel poorly. + s/s aspiration with decreased oral control and anterior spill and possible premature spill given immediate reflexive cough after cup sip water. He consumed nectar thick with increased coordination, control and no cough/throat clear and is at increased risk of transient aspiration despite modifications presently. SLP ordered Dys 1 (puree), nectar thick liquids, crush meds, allow rest breaks and eat only when respirations are stable. Suspect intake will be poor. ST will follow.   SLP Visit Diagnosis: Dysphagia, unspecified (R13.10)    Aspiration Risk  Moderate aspiration risk    Diet Recommendation  Dysphagia 1 (Puree);Nectar-thick liquid   Liquid Administration via: Cup;No straw Medication Administration: Crushed with puree Supervision: Full supervision/cueing for compensatory strategies;Patient able to self feed Compensations: Slow rate;Small sips/bites Postural Changes: Seated upright at 90 degrees    Other  Recommendations Oral Care Recommendations: Oral care BID Other Recommendations: Order thickener from pharmacy   Follow up Recommendations (TBD)      Frequency and Duration min 2x/week  2 weeks       Prognosis Prognosis for Safe Diet Advancement: Fair      Swallow Study   General HPI: Tom Chavez is a 85 y.o. male with a medical history HLD, HTN, Bradycardia w/ pacemaker presenting after reports at [facility] of increasing confusion, hypoxemia and confusion and weakness. O2 sats dropped to 74% on 3L pri0or to admissoin. Of note pt was admitted on 08/16/19 w/ COVID pneumonia. Admitted to [facility] and DC to [facility] on 3L [place] at baseline.  Type of Study: Bedside Swallow Evaluation Previous Swallow Assessment: (none found) Diet Prior to this Study: NPO Temperature Spikes Noted: No Respiratory Status: Nasal cannula History of Recent Intubation: No Behavior/Cognition: Lethargic/Drowsy;Requires cueing Oral Cavity Assessment: Within Functional Limits Oral Care Completed by SLP: No Oral Cavity - Dentition: Other (Comment)(most are natural) Self-Feeding Abilities: Needs assist Patient Positioning: Upright in bed Baseline Vocal Quality: Normal Volitional Cough: Weak Volitional Swallow: Able to elicit    Oral/Motor/Sensory Function Overall Oral Motor/Sensory Function: Within functional limits   Ice Chips Ice chips: Not tested   Thin Liquid Thin Liquid: Impaired Presentation: Cup Oral Phase Impairments: Reduced lingual movement/coordination;Reduced labial  seal Oral Phase Functional Implications: Other (comment)(anterior spill) Pharyngeal  Phase Impairments: Cough -  Immediate;Suspected delayed Swallow;Change in Vital Signs    Nectar Thick Nectar Thick Liquid: Impaired Presentation: Cup Pharyngeal Phase Impairments: Suspected delayed Swallow   Honey Thick Honey Thick Liquid: Impaired Presentation: Cup Pharyngeal Phase Impairments: Suspected delayed Swallow   Puree     Solid     Solid: Not tested      Tom Chavez 09/02/2019,3:36 PM Tom Chavez.Ed Nurse, children's [phone] Office [phone]

## 2019-09-02 NOTE — ED Notes (Signed)
Report given to [facility]  

## 2019-09-02 NOTE — Progress Notes (Signed)
Pharmacy Antibiotic Note  Tom Chavez is a 85 y.o. male admitted on 08/30/2019 with pneumonia.  Pharmacy has been consulted for Vancomycin and Cefepime dosing.  AKI on admit  Plan: Decrease cefepime to 2g IV Q24h Change vancomycin to 1g IV Q48h Monitor clinical picture, renal function, vanc levels prn F/U C&S, abx deescalation / LOT  Height: 5\' 10"  (177.8 cm) Weight: 145 lb 1 oz (65.8 kg) IBW/kg (Calculated) : 73  Temp (24hrs), Avg:97.5 F (36.4 C), Min:97.4 F (36.3 C), Max:97.6 F (36.4 C)  Recent Labs  Lab 08/19/2019 1549 08/15/2019 1745 08/27/2019 2126 09/02/19 0436  WBC 13.1*  --   --   --   CREATININE 2.74*  --   --  2.39*  LATICACIDVEN 3.5* 3.5* 2.4*  --     Estimated Creatinine Clearance: 16.1 mL/min (A) (by C-G formula based on SCr of 2.39 mg/dL (H)).    No Known Allergies  Antimicrobials this admission: Vanco 12/29 >>  Cefepime 12/29 >>   Dose adjustments this admission: Vanco/Cefepime  Microbiology results: 12/29 BCx: pending 12/29 UCx: pending   12/29 MRSA PCR: pending  Thank you for allowing pharmacy to be a part of this patient's care.  Reginia Naas 09/02/2019 1:46 PM

## 2019-09-02 NOTE — Progress Notes (Signed)
PROGRESS NOTE  Tom Chavez ZOX:[id number] DOB: June 04, 1919 DOA: 08/04/2019 PCP: Kirstie PeriShah, Ashish, MD  Brief History:  85 year old male with a history of hypertension, hyperlipidemia, restless leg syndrome, symptomatic bradycardia status post PPM presenting from SNF with increasing generalized weakness, confusion.  The patient was recently admitted to the hospital from 08/16/2019 to 08/22/2019 during which time he was treated for acute respiratory failure secondary to COVID-19 pneumonia.  At the time of discharge, the patient was on room air with oxygen saturation 93-95%.  Apparently, the patient was noted to have oxygen saturation of 74% on 3 L at his nursing facility.  He was also hypothermic upon arrival to the emergency department.  Unfortunately, the patient is unable to provide any history given his encephalopathy. In the emergency department, the patient was afebrile hemodynamically stable albeit with soft blood pressures.  Oxygen saturation was 93-94% on 3 L.  BMP showed a serum creatinine of 2.39 with sodium 131 and potassium 3.4.  WBC 13.1, hemoglobin 10.5, platelets 259,000.  Chest x-ray showed subtle bilateral ill-defined pulmonary opacities.  Lactic acid peaked at 3.5.  The patient was started on cefepime and vancomycin for presumptive pneumonia.  Assessment/Plan: Sepsis with organ dysfunction -due to post-COVID pneumonia -Initial positive Covid test 08/16/2019 -Present at time of admission -Lactic acid peaked 3.5 -PCT 0.58 -Continue IV fluids -MRSA PCR -Wean Solu-Cortef  HCAP -Continue vancomycin and cefepime pending culture data -Personally reviewed chest x-ray--bilateral patchy opacities, recurrent left  Acute respiratory failure with hypoxia -Secondary to pneumonia -Currently stable on 3 L -Wean oxygen as tolerated for saturation greater than 92%  Acute on chronic renal failure--CKD stage III -Baseline creatinine 1.0-1.3 -Presenting serum creatinine  2.74 -Continue IV fluids  Hyponatremia -Secondary to volume -Continue IV fluids  Hyperlipidemia -Continue statin  LBBB -personally reviewed EKG--sinus, LBBB unchanged       Disposition Plan:   Transfer to [facility] Family Communication:  No Family at bedside  Consultants:  none  Code Status:  FULL   DVT Prophylaxis:   [place] Lovenox   Procedures: As Listed in Progress Note Above  Antibiotics: vanc 12/29>>> Cefepime 12/29>>>       Subjective: Pt is pleasantly confused, but denies cp, sob, vomiting, diarrhea, abd pain.  Remainder ROS not obtainable due to confusion  Objective: Vitals:   09/02/19 0600 09/02/19 0615 09/02/19 0630 09/02/19 0730  BP: (!) 124/42  (!) 119/43 99/69  Pulse: 79 83  93  Resp: (!) 23 (!) 21 (!) 31 (!) 39  Temp:      TempSrc:      SpO2: 94% 93%  (!) 89%  Weight:      Height:        Intake/Output Summary (Last 24 hours) at 09/02/2019 1000 Last data filed at 08/05/2019 1634 Gross per 24 hour  Intake 100 ml  Output 100 ml  Net 0 ml   Weight change:  Exam:   General:  Pt is alert, does not follow commands appropriately, not in acute distress  HEENT: No icterus, No thrush, No neck mass, [place]/AT  Cardiovascular: RRR, S1/S2, no rubs, no gallops  Respiratory: bilateral scattered rales  Abdomen: Soft/+BS, non tender, non distended, no guarding  Extremities: No edema, No lymphangitis, No petechiae, No rashes, no synovitis   Data Reviewed: I have personally reviewed following labs and imaging studies Basic Metabolic Panel: Recent Labs  Lab 08/29/2019 1549 09/02/19 0436  NA 127* 131*  K 4.3 3.4*  CL 95*  104  CO2 15* 14*  GLUCOSE 199* 122*  BUN 86* 81*  CREATININE 2.74* 2.39*  CALCIUM 8.7* 7.8*   Liver Function Tests: Recent Labs  Lab 08/11/2019 1549  AST 63*  ALT 110*  ALKPHOS 88  BILITOT 1.1  PROT 6.1*  ALBUMIN 2.4*   No results for input(s): LIPASE, AMYLASE in the last 168 hours. No results for input(s): AMMONIA in  the last 168 hours. Coagulation Profile: Recent Labs  Lab 09/03/2019 1549 09/02/19 0436  INR 1.3* 1.5*   CBC: Recent Labs  Lab 09/03/2019 1549  WBC 13.1*  NEUTROABS 12.1*  HGB 8.4*  HCT 25.7*  MCV 88.3  PLT 259   Cardiac Enzymes: No results for input(s): CKTOTAL, CKMB, CKMBINDEX, TROPONINI in the last 168 hours. BNP: Invalid input(s): POCBNP CBG: No results for input(s): GLUCAP in the last 168 hours. HbA1C: No results for input(s): HGBA1C in the last 72 hours. Urine analysis:    Component Value Date/Time   COLORURINE YELLOW 09/02/2019 0900   APPEARANCEUR CLEAR 09/02/2019 0900   LABSPEC 1.016 09/02/2019 0900   PHURINE 5.0 09/02/2019 0900   GLUCOSEU NEGATIVE 09/02/2019 0900   HGBUR SMALL (A) 09/02/2019 0900   BILIRUBINUR NEGATIVE 09/02/2019 0900   KETONESUR NEGATIVE 09/02/2019 0900   PROTEINUR NEGATIVE 09/02/2019 0900   NITRITE NEGATIVE 09/02/2019 0900   LEUKOCYTESUR NEGATIVE 09/02/2019 0900   Sepsis Labs: @LABRCNTIP (procalcitonin:4,lacticidven:4) ) Recent Results (from the past 240 hour(s))  Blood Culture (routine x 2)     Status: None (Preliminary result)   Collection Time: 08/20/2019  3:49 PM   Specimen: Right Antecubital; Blood  Result Value Ref Range Status   Specimen Description RIGHT ANTECUBITAL  Final   Special Requests   Final    BOTTLES DRAWN AEROBIC AND ANAEROBIC Blood Culture adequate volume   Culture   Final    NO GROWTH < 24 HOURS Performed at [facility], [address] [place], [place]    Report Status PENDING  Incomplete  Blood Culture (routine x 2)     Status: None (Preliminary result)   Collection Time: 08/19/2019  3:50 PM   Specimen: BLOOD RIGHT HAND  Result Value Ref Range Status   Specimen Description BLOOD RIGHT HAND  Final   Special Requests   Final    BOTTLES DRAWN AEROBIC AND ANAEROBIC Blood Culture adequate volume   Culture   Final    NO GROWTH < 24 HOURS Performed at [facility], [address], [place], [place]  [place]    Report Status PENDING  Incomplete     Scheduled Meds: . atorvastatin  20 mg Oral Daily  . heparin injection (subcutaneous)  5,000 Units Subcutaneous Q8H  . hydrocortisone sod succinate (SOLU-CORTEF) inj  50 mg Intravenous Q6H  . rOPINIRole  0.25 mg Oral QHS   Continuous Infusions: . sodium chloride 75 mL/hr at 08/24/2019 2230  . ceFEPime (MAXIPIME) IV Stopped (09/02/19 0640)  . vancomycin      Procedures/Studies: DG Chest Port 1 View  Result Date: 08/18/2019 CLINICAL DATA:  Cough, recent COVID, hypoxia. EXAM: PORTABLE CHEST 1 VIEW COMPARISON:  Chest radiograph 08/16/2011. FINDINGS: Redemonstrated left chest dual lead pacer device. Heart size at the upper limits of normal. Aortic atherosclerosis. Again demonstrated is bilateral interstitial prominence and subtle ill-defined bilateral patchy pulmonary opacities. Likely scarring within the right mid to upper lung field. No evidence of pleural effusion or pneumothorax. No acute bony abnormality. IMPRESSION: Interstitial prominence with subtle bilateral patchy ill-defined pulmonary opacities. Findings are similar to  prior examination 08/16/2019 and suspicious for atypical/viral pneumonia given provided history. Probable scarring within the right mid to upper lung. Heart size at the upper limits of normal. Electronically Signed   By: Jackey Loge DO   On: 09-15-2019 15:59   DG Chest Port 1 View  Result Date: 08/16/2019 CLINICAL DATA:  Shortness of breath for 2 days, COVID-19 positivity. EXAM: PORTABLE CHEST 1 VIEW COMPARISON:  None. FINDINGS: Cardiac shadow is enlarged. Pacing device is noted. Lungs are well aerated bilaterally. Patchy parenchymal opacities are noted bilaterally consistent with atypical pneumonia. No sizable effusion is seen. No bony abnormality is noted. IMPRESSION: Patchy opacities bilaterally consistent with atypical/viral pneumonia. This is consistent with the patient's given clinical history of COVID-19 positivity.  Electronically Signed   By: Alcide Clever M.D.   On: 08/16/2019 18:01   VAS [place] LOWER EXTREMITY VENOUS (DVT)  Result Date: 08/22/2019  Lower Venous Study Indications: Edema.  Comparison Study: no prior Performing Technologist: Blanch Media RVS  Examination Guidelines: A complete evaluation includes B-mode imaging, spectral Doppler, color Doppler, and power Doppler as needed of all accessible portions of each vessel. Bilateral testing is considered an integral part of a complete examination. Limited examinations for reoccurring indications may be performed as noted.  +---------+---------------+---------+-----------+----------+--------------+ RIGHT    CompressibilityPhasicitySpontaneityPropertiesThrombus Aging +---------+---------------+---------+-----------+----------+--------------+ CFV      Full           Yes      Yes                                 +---------+---------------+---------+-----------+----------+--------------+ SFJ      Full                                                        +---------+---------------+---------+-----------+----------+--------------+ FV Prox  Full                                                        +---------+---------------+---------+-----------+----------+--------------+ FV Mid   Full                                                        +---------+---------------+---------+-----------+----------+--------------+ FV DistalFull                                                        +---------+---------------+---------+-----------+----------+--------------+ PFV      Full                                                        +---------+---------------+---------+-----------+----------+--------------+ POP      Full  Yes      Yes                                 +---------+---------------+---------+-----------+----------+--------------+ PTV      Full                                                         +---------+---------------+---------+-----------+----------+--------------+ PERO     Full                                                        +---------+---------------+---------+-----------+----------+--------------+   +---------+---------------+---------+-----------+----------+--------------+ LEFT     CompressibilityPhasicitySpontaneityPropertiesThrombus Aging +---------+---------------+---------+-----------+----------+--------------+ CFV      Full           Yes      Yes                                 +---------+---------------+---------+-----------+----------+--------------+ SFJ      Full                                                        +---------+---------------+---------+-----------+----------+--------------+ FV Prox  Full                                                        +---------+---------------+---------+-----------+----------+--------------+ FV Mid   Full                                                        +---------+---------------+---------+-----------+----------+--------------+ FV DistalFull                                                        +---------+---------------+---------+-----------+----------+--------------+ PFV      Full                                                        +---------+---------------+---------+-----------+----------+--------------+ POP      Full           Yes      Yes                                 +---------+---------------+---------+-----------+----------+--------------+ PTV  Full                                                        +---------+---------------+---------+-----------+----------+--------------+ PERO     Full                                                        +---------+---------------+---------+-----------+----------+--------------+     Summary: Right: There is no evidence of deep vein thrombosis in the lower extremity. No cystic structure found in the  popliteal fossa. Left: There is no evidence of deep vein thrombosis in the lower extremity. No cystic structure found in the popliteal fossa.  *See table(s) above for measurements and observations. Electronically signed by Harold Barban MD on 08/22/2019 at 4:41:37 PM.    Final     Orson Eva, DO  [facility] Pager [phone]  If 7PM-7AM, please contact night-coverage [email or web address] Password [email or web address] 09/02/2019, 10:00 AM   LOS: 1 day

## 2019-09-02 NOTE — Progress Notes (Addendum)
Seen on arrival to [facility]. A&Ox1, tachypneic in high 20's.  Had low blood pressure which resolved on repeat, think this was inaccurate as second BP in 825'K systolic.  No LEE, CTAB, RRR, s/nt/nd. Discussed with pt wife, Juliann Pulse.  Discussed code status.  She noted that they both had paperwork that said they would not want resuscitation.  Discuss this was reasonable for Mr. Strahan, will change to DNR.  Given general update.  Will continue current plan of care, though discussed my concern for poor prognosis in general and if things to not improve will need to revisit plan of care.  See Dr. Doristine Devoid note for additional details.  Will change NS to sodium bicarb based on NAGMA.  Transfuse 1 unit pRBC due to anemia (suspect this is dilutional, no si/sx bleeding), discussed with daughter.

## 2019-09-03 ENCOUNTER — Inpatient Hospital Stay (HOSPITAL_COMMUNITY): Payer: Medicare Other

## 2019-09-03 DIAGNOSIS — J1289 Other viral pneumonia: Secondary | ICD-10-CM

## 2019-09-03 DIAGNOSIS — U071 COVID-19: Secondary | ICD-10-CM

## 2019-09-03 LAB — CBC WITH DIFFERENTIAL/PLATELET
Abs Immature Granulocytes: 0.09 10*3/uL — ABNORMAL HIGH (ref 0.00–0.07)
Basophils Absolute: 0 10*3/uL (ref 0.0–0.1)
Basophils Relative: 0 %
Eosinophils Absolute: 0 10*3/uL (ref 0.0–0.5)
Eosinophils Relative: 0 %
HCT: 21.7 % — ABNORMAL LOW (ref 39.0–52.0)
Hemoglobin: 7.3 g/dL — ABNORMAL LOW (ref 13.0–17.0)
Immature Granulocytes: 1 %
Lymphocytes Relative: 3 %
Lymphs Abs: 0.3 10*3/uL — ABNORMAL LOW (ref 0.7–4.0)
MCH: 29.1 pg (ref 26.0–34.0)
MCHC: 33.6 g/dL (ref 30.0–36.0)
MCV: 86.5 fL (ref 80.0–100.0)
Monocytes Absolute: 0.4 10*3/uL (ref 0.1–1.0)
Monocytes Relative: 4 %
Neutro Abs: 8.3 10*3/uL — ABNORMAL HIGH (ref 1.7–7.7)
Neutrophils Relative %: 92 %
Platelets: 219 10*3/uL (ref 150–400)
RBC: 2.51 MIL/uL — ABNORMAL LOW (ref 4.22–5.81)
RDW: 14.7 % (ref 11.5–15.5)
WBC: 9 10*3/uL (ref 4.0–10.5)
nRBC: 0 % (ref 0.0–0.2)

## 2019-09-03 LAB — URINALYSIS, ROUTINE W REFLEX MICROSCOPIC
Bilirubin Urine: NEGATIVE
Glucose, UA: NEGATIVE mg/dL
Ketones, ur: 5 mg/dL — AB
Nitrite: NEGATIVE
Protein, ur: 30 mg/dL — AB
RBC / HPF: >50 RBC/hpf — ABNORMAL HIGH (ref 0–5)
Specific Gravity, Urine: 1.015 (ref 1.005–1.030)
pH: 5 (ref 5.0–8.0)

## 2019-09-03 LAB — LEGIONELLA PNEUMOPHILA SEROGP 1 UR AG: L. pneumophila Serogp 1 Ur Ag: NEGATIVE

## 2019-09-03 LAB — FERRITIN: Ferritin: 1398 ng/mL — ABNORMAL HIGH (ref 24–336)

## 2019-09-03 LAB — COMPREHENSIVE METABOLIC PANEL
ALT: 59 U/L — ABNORMAL HIGH (ref 0–44)
AST: 36 U/L (ref 15–41)
Albumin: 2 g/dL — ABNORMAL LOW (ref 3.5–5.0)
Alkaline Phosphatase: 67 U/L (ref 38–126)
Anion gap: 12 (ref 5–15)
BUN: 65 mg/dL — ABNORMAL HIGH (ref 8–23)
CO2: 17 mmol/L — ABNORMAL LOW (ref 22–32)
Calcium: 8.1 mg/dL — ABNORMAL LOW (ref 8.9–10.3)
Chloride: 108 mmol/L (ref 98–111)
Creatinine, Ser: 1.82 mg/dL — ABNORMAL HIGH (ref 0.61–1.24)
GFR calc Af Amer: 35 mL/min — ABNORMAL LOW (ref >60)
GFR calc non Af Amer: 30 mL/min — ABNORMAL LOW (ref >60)
Glucose, Bld: 113 mg/dL — ABNORMAL HIGH (ref 70–99)
Potassium: 2.6 mmol/L — CL (ref 3.5–5.1)
Sodium: 137 mmol/L (ref 135–145)
Total Bilirubin: 0.9 mg/dL (ref 0.3–1.2)
Total Protein: 4.7 g/dL — ABNORMAL LOW (ref 6.5–8.1)

## 2019-09-03 LAB — BASIC METABOLIC PANEL
Anion gap: 11 (ref 5–15)
BUN: 52 mg/dL — ABNORMAL HIGH (ref 8–23)
CO2: 21 mmol/L — ABNORMAL LOW (ref 22–32)
Calcium: 8.1 mg/dL — ABNORMAL LOW (ref 8.9–10.3)
Chloride: 107 mmol/L (ref 98–111)
Creatinine, Ser: 1.45 mg/dL — ABNORMAL HIGH (ref 0.61–1.24)
GFR calc Af Amer: 46 mL/min — ABNORMAL LOW (ref >60)
GFR calc non Af Amer: 40 mL/min — ABNORMAL LOW (ref >60)
Glucose, Bld: 116 mg/dL — ABNORMAL HIGH (ref 70–99)
Potassium: 3 mmol/L — ABNORMAL LOW (ref 3.5–5.1)
Sodium: 139 mmol/L (ref 135–145)

## 2019-09-03 LAB — MRSA PCR SCREENING: MRSA by PCR: NEGATIVE

## 2019-09-03 LAB — IRON AND TIBC
Iron: 31 ug/dL — ABNORMAL LOW (ref 45–182)
Saturation Ratios: 23 % (ref 17.9–39.5)
TIBC: 134 ug/dL — ABNORMAL LOW (ref 250–450)
UIBC: 103 ug/dL

## 2019-09-03 LAB — FOLATE: Folate: 7.2 ng/mL (ref >5.9)

## 2019-09-03 LAB — D-DIMER, QUANTITATIVE: D-Dimer, Quant: 1.38 ug/mL-FEU — ABNORMAL HIGH (ref 0.00–0.50)

## 2019-09-03 LAB — C-REACTIVE PROTEIN: CRP: 9.1 mg/dL — ABNORMAL HIGH (ref <1.0)

## 2019-09-03 LAB — ABO/RH: ABO/RH(D): A POS

## 2019-09-03 LAB — MAGNESIUM: Magnesium: 2.2 mg/dL (ref 1.7–2.4)

## 2019-09-03 LAB — HEMOGLOBIN AND HEMATOCRIT, BLOOD
HCT: 21.8 % — ABNORMAL LOW (ref 39.0–52.0)
Hemoglobin: 7.4 g/dL — ABNORMAL LOW (ref 13.0–17.0)

## 2019-09-03 LAB — VITAMIN B12: Vitamin B-12: 1463 pg/mL — ABNORMAL HIGH (ref 180–914)

## 2019-09-03 LAB — PHOSPHORUS: Phosphorus: 3.3 mg/dL (ref 2.5–4.6)

## 2019-09-03 MED ORDER — HYDROCORTISONE NA SUCCINATE PF 100 MG IJ SOLR
50.0000 mg | Freq: Once | INTRAMUSCULAR | Status: DC
  Filled 2019-09-03: qty 2

## 2019-09-03 MED ORDER — DEXTROSE IN LACTATED RINGERS 5 % IV SOLN: INTRAVENOUS | Status: AC

## 2019-09-03 MED ORDER — POTASSIUM CHLORIDE 20 MEQ PO PACK
40.0000 meq | PACK | ORAL | Status: AC
  Filled 2019-09-03 (×2): qty 2

## 2019-09-03 MED ORDER — SODIUM BICARBONATE-DEXTROSE 150-5 MEQ/L-% IV SOLN
150.0000 meq | INTRAVENOUS | Status: DC
  Administered 2019-09-03: 150 meq via INTRAVENOUS
  Filled 2019-09-03: qty 1000

## 2019-09-03 MED ORDER — POTASSIUM CHLORIDE 10 MEQ/100ML IV SOLN
10.0000 meq | INTRAVENOUS | Status: AC
  Administered 2019-09-03 (×4): 10 meq via INTRAVENOUS
  Filled 2019-09-03: qty 100

## 2019-09-03 MED ORDER — POTASSIUM CHLORIDE 10 MEQ/100ML IV SOLN
10.0000 meq | INTRAVENOUS | Status: AC
  Administered 2019-09-03 (×4): 10 meq via INTRAVENOUS
  Filled 2019-09-03 (×3): qty 100

## 2019-09-03 NOTE — Progress Notes (Signed)
TRH night shift.  The patient's FC was noticed to have some light hematuria (pink colored with some occasional small clots). His Hb is 6.5 gr/dL, but receiving will be receiving 1 PRBC unit  transfusion tonight. Heparin discontinued. SCDs ordered. Keep FC inserted.   Tennis Must, MD

## 2019-09-03 NOTE — Progress Notes (Signed)
Initial Nutrition Assessment  DOCUMENTATION CODES:   Not applicable  INTERVENTION:   H[employer]el Shake daily with Breakfast which provides 520 kcals and 22 g of protein.  Magic cup BID with lunch and dinner, each supplement provides 290 kcal and 9 grams of protein, automatically on meal trays to optimize nutritional intake.   NUTRITION DIAGNOSIS:   Increased nutrient needs related to acute illness(COVID-19) as evidenced by estimated needs.  GOAL:   Patient will meet greater than or equal to 90% of their needs  MONITOR:   PO intake, Supplement acceptance, Skin  REASON FOR ASSESSMENT:   Malnutrition Screening Tool    ASSESSMENT:   85 yo male admitted from SNF with increasing weakness and confusion. Recent hospitalization 12/13-12/19 for COVID-19 PNA. PMH includes HTN, HLD, RLS, bradycardia s/p PPM.   Patient has been made DNR.  S/P swallow evaluation with SLP 12/30, diet advanced to dysphagia 1 with nectar thick liquids. No meal intakes documented. Suspect intake has been poor.  Labs reviewed. Potassium 2.6 (L), BUN 65 (H), creatinine 1.82 (H)  Medications reviewed and include solu-cortef, Klor-con.  Suspect patient is malnourished, unable to obtain enough information at this time for identification of malnutrition. He is 13% below ideal weight. No recent weights available for comparison.   Patient would benefit from PO supplements to maximize intake to help meet increased nutrition needs for COVID-19.  NUTRITION - FOCUSED PHYSICAL EXAM:  deferred due to COVID restrictions   Diet Order:   Diet Order            DIET - DYS 1 Room service appropriate? Yes; Fluid consistency: Nectar Thick  Diet effective now              EDUCATION NEEDS:   Not appropriate for education at this time  Skin:  Skin Assessment: Reviewed RN Assessment  Last BM:  no BM documented since admission  Height:   Ht Readings from Last 1 Encounters:  10-01-2019 5\' 10"  (1.778 m)    Weight:    Wt Readings from Last 1 Encounters:  October 01, 2019 65.8 kg    Ideal Body Weight:  75.5 kg  BMI:  Body mass index is 20.81 kg/m.  Estimated Nutritional Needs:   Kcal:  2000-2200  Protein:  100-120 gm  Fluid:  >/= 1.8 L    Molli Barrows, RD, LDN, [place] Pager [phone] After Hours Pager [phone]

## 2019-09-03 NOTE — Progress Notes (Addendum)
PROGRESS NOTE    Tom Chavez  WGN:[id number] DOB: 10-11-1918 DOA: 08/26/2019 PCP: Monico Blitz, MD   Brief Narrative:  85 year old male with Tom Chavez history of hypertension, hyperlipidemia, restless leg syndrome, symptomatic bradycardia status post PPM presenting from SNF with increasing generalized weakness, confusion.  The patient was recently admitted to the hospital from 08/16/2019 to 08/22/2019 during which time he was treated for acute respiratory failure secondary to COVID-19 pneumonia.  At the time of discharge, the patient was on room air with oxygen saturation 93-95%.  Apparently, the patient was noted to have oxygen saturation of 74% on 3 L at his nursing facility.  He was also hypothermic upon arrival to the emergency department.  Unfortunately, the patient is unable to provide any history given his encephalopathy. In the emergency department, the patient was afebrile hemodynamically stable albeit with soft blood pressures.  Oxygen saturation was 93-94% on 3 L.  BMP showed Athony Coppa serum creatinine of 2.39 with sodium 131 and potassium 3.4.  WBC 13.1, hemoglobin 10.5, platelets 259,000.  Chest x-ray showed subtle bilateral ill-defined pulmonary opacities.  Lactic acid peaked at 3.5.  The patient was started on cefepime and vancomycin for presumptive pneumonia.  Assessment & Plan:   Active Problems:   HTN (hypertension)   HLD (hyperlipidemia)   Restless leg syndrome   Sepsis (HCC)   LBBB (left bundle branch block)   AKI (acute kidney injury) ([place])   Hyponatremia   Bradycardia   Acute on chronic respiratory failure with hypoxia (HCC)   Metabolic acidosis   Severe sepsis (HCC)   Lobar pneumonia (HCC)   Acute renal failure superimposed on stage 3a chronic kidney disease (HCC)  Acute Hypoxic Respiratory Failure 2/2 COVID 19 Virus Infection  Sepsis  Possible HCAP:  Treated for COVID from 12/13-12/19 and discharged on RA Readmitted on 12/29 with AHRF, met criteria for sepsis Currently  requiring 6 L [place] Currently treating for possible HCAP with cefepime Elevated lactate, downtrending Procalcitonin 0.58 MRSA PCR negative, discontinue vancomycin Follow blood and urine culture Will continue to wean steroids CXR 12/31 with slight increase in airspace opacities  COVID-19 Labs  Recent Labs    09/03/19 0230  DDIMER 1.38*  FERRITIN 1,398*  CRP 9.1*    No results found for: [id number]  Acute Metabolic Encephalopathy: 2/2 above, follow closely.  He's more confused today, less able to engage in conversation.  Does not currently appear safe to swallow per nursing.  NPO for now, will have speech reevaluate.   Acute on chronic renal failure--CKD stage III  Non Anion Gap Metabolic Acidosis -Baseline creatinine 1.0-1.3 -Presenting serum creatinine 2.74 -Creatinine improving today -Continue bicarb for NAGMA  Hypokalemia: replace and follow  Hyponatremia -Secondary to volume -improved  Hyperlipidemia -Continue statin  LBBB - noted  Hematuria: urine clear this AM, follow  Anemia: suspect dilutional with IVF.  Follow B12, folate, iron panel.  S/p 1 unit pRBC, trend H/H  Foley in place: will discuss indication with RN  Elevated LFT's: improved  DVT prophylaxis: SCD Code Status: DNR Family Communication: none at bedside - discussed with daughter over phone 12/31 Disposition Plan: pending further improvemnet   Consultants:   none  Procedures:  none  Antimicrobials:  Anti-infectives (From admission, onward)   Start     Dose/Rate Route Frequency Ordered Stop   09/03/19 2200  vancomycin (VANCOCIN) IVPB 1000 mg/200 mL premix  Status:  Discontinued     1,000 mg 200 mL/hr over 60 Minutes Intravenous Every 48 hours 09/02/19 1352 09/03/19 4132  09/03/19 0600  ceFEPIme (MAXIPIME) 2 g in sodium chloride 0.9 % 100 mL IVPB     2 g 200 mL/hr over 30 Minutes Intravenous Every 24 hours 09/02/19 1342     09/02/19 1800  vancomycin (VANCOREADY) IVPB 750 mg/150  mL  Status:  Discontinued     750 mg 150 mL/hr over 60 Minutes Intravenous Every 24 hours 08/26/2019 1541 09/02/19 1339   09/02/19 0600  ceFEPIme (MAXIPIME) 2 g in sodium chloride 0.9 % 100 mL IVPB  Status:  Discontinued     2 g 200 mL/hr over 30 Minutes Intravenous Every 12 hours 08/31/2019 1538 09/02/19 1342   08/12/2019 1600  vancomycin (VANCOREADY) IVPB 1500 mg/300 mL     1,500 mg 150 mL/hr over 120 Minutes Intravenous  Once 08/13/2019 1539 09/02/19 0829   08/20/2019 1545  vancomycin (VANCOCIN) IVPB 1000 mg/200 mL premix  Status:  Discontinued     1,000 mg 200 mL/hr over 60 Minutes Intravenous  Once 08/12/2019 1537 08/31/2019 1539   08/25/2019 1545  ceFEPIme (MAXIPIME) 2 g in sodium chloride 0.9 % 100 mL IVPB     2 g 200 mL/hr over 30 Minutes Intravenous  Once 08/25/2019 1537 08/26/2019 1634     Subjective: More confused today Less able to engage in meaningful converation Though apparently per RN he said "I love you" to daughter over facetime  Objective: Vitals:   09/03/19 0615 09/03/19 0630 09/03/19 0730 09/03/19 1134  BP:  (!) 107/45 (!) 107/44 (!) 120/57  Pulse: (!) 101 62 72 86  Resp: (!) 38 (!) '27 19 20  '$ Temp:   98.5 F (36.9 C) 98.2 F (36.8 C)  TempSrc:   Oral Axillary  SpO2: (!) 83% 93% 100% 100%  Weight:      Height:        Intake/Output Summary (Last 24 hours) at 09/03/2019 1425 Last data filed at 09/03/2019 0548 Gross per 24 hour  Intake 1501.28 ml  Output 1175 ml  Net 326.28 ml   Filed Weights   08/06/2019 1527  Weight: 65.8 kg    Examination:  General exam: Appears calm and comfortable  Respiratory system: Clear to auscultation. Respiratory effort normal. Cardiovascular system: RRR Gastrointestinal system: Abdomen is nondistended, soft and nontender Central nervous system: Confused.  Follows simple commands to squeeze fingers and wiggle toes. No focal neurological deficits. Extremities: no LEE Skin: No rashes, lesions or ulcers  Data Reviewed: I have personally  reviewed following labs and imaging studies  CBC: Recent Labs  Lab 08/15/2019 1549 09/02/19 1250 09/03/19 0230  WBC 13.1* 9.2 9.0  NEUTROABS 12.1* 8.7* 8.3*  HGB 8.4* 6.5* 7.3*  HCT 25.7* 20.4* 21.7*  MCV 88.3 88.3 86.5  PLT 259 222 229   Basic Metabolic Panel: Recent Labs  Lab 08/24/2019 1549 09/02/19 0436 09/03/19 0230  NA 127* 131* 137  K 4.3 3.4* 2.6*  CL 95* 104 108  CO2 15* 14* 17*  GLUCOSE 199* 122* 113*  BUN 86* 81* 65*  CREATININE 2.74* 2.39* 1.82*  CALCIUM 8.7* 7.8* 8.1*  MG  --   --  2.2  PHOS  --   --  3.3   GFR: Estimated Creatinine Clearance: 21.1 mL/min (Taeya Theall) (by C-G formula based on SCr of 1.82 mg/dL (H)). Liver Function Tests: Recent Labs  Lab 08/11/2019 1549 09/03/19 0230  AST 63* 36  ALT 110* 59*  ALKPHOS 88 67  BILITOT 1.1 0.9  PROT 6.1* 4.7*  ALBUMIN 2.4* 2.0*   No results for input(s):  LIPASE, AMYLASE in the last 168 hours. No results for input(s): AMMONIA in the last 168 hours. Coagulation Profile: Recent Labs  Lab 08/21/2019 1549 09/02/19 0436  INR 1.3* 1.5*   Cardiac Enzymes: No results for input(s): CKTOTAL, CKMB, CKMBINDEX, TROPONINI in the last 168 hours. BNP (last 3 results) No results for input(s): PROBNP in the last 8760 hours. HbA1C: No results for input(s): HGBA1C in the last 72 hours. CBG: No results for input(s): GLUCAP in the last 168 hours. Lipid Profile: No results for input(s): CHOL, HDL, LDLCALC, TRIG, CHOLHDL, LDLDIRECT in the last 72 hours. Thyroid Function Tests: No results for input(s): TSH, T4TOTAL, FREET4, T3FREE, THYROIDAB in the last 72 hours. Anemia Panel: Recent Labs    09/03/19 0230  FERRITIN 1,398*   Sepsis Labs: Recent Labs  Lab 08/25/2019 1549 08/29/2019 1745 09/03/2019 2126 09/02/19 0436  PROCALCITON  --   --   --  0.58  LATICACIDVEN 3.5* 3.5* 2.4*  --     Recent Results (from the past 240 hour(s))  Blood Culture (routine x 2)     Status: None (Preliminary result)   Collection Time: 08/28/2019   3:49 PM   Specimen: Right Antecubital; Blood  Result Value Ref Range Status   Specimen Description RIGHT ANTECUBITAL  Final   Special Requests   Final    BOTTLES DRAWN AEROBIC AND ANAEROBIC Blood Culture adequate volume   Culture   Final    NO GROWTH 2 DAYS Performed at [facility], [address] [place], [place] [address]    Report Status PENDING  Incomplete  Blood Culture (routine x 2)     Status: None (Preliminary result)   Collection Time: 08/17/2019  3:50 PM   Specimen: BLOOD RIGHT HAND  Result Value Ref Range Status   Specimen Description BLOOD RIGHT HAND  Final   Special Requests   Final    BOTTLES DRAWN AEROBIC AND ANAEROBIC Blood Culture adequate volume   Culture   Final    NO GROWTH 2 DAYS Performed at [facility], [address], [place], [place] [address]    Report Status PENDING  Incomplete  Urine culture     Status: None   Collection Time: 08/23/2019  4:45 PM   Specimen: In/Out Cath Urine  Result Value Ref Range Status   Specimen Description   Final    IN/OUT CATH URINE Performed at [facility], [address], [place], [place] [address]    Special Requests   Final    NONE Performed at [facility], [address], [place], [place] [address]    Culture   Final    NO GROWTH Performed at [facility], [place] [address] [place], [place] [address]    Report Status 09/02/2019 FINAL  Final  MRSA PCR Screening     Status: None   Collection Time: 09/02/19  1:37 PM   Specimen: Nasopharyngeal  Result Value Ref Range Status   MRSA by PCR NEGATIVE NEGATIVE Final    Comment:        The GeneXpert MRSA Assay (FDA approved for NASAL specimens only), is one component of [facility]as[facility] Hehl comprehensive MRSA colonization surveillance program. It is not intended to diagnose MRSA infection nor to guide or monitor treatment for MRSA infections. Performed at [facility], [place] [address] [place], [place] [address]          Radiology Studies: DG CHEST PORT 1  VIEW  Result Date: 09/03/2019 CLINICAL DATA:  Hypoxia, history of COVID-19 positivity.  EXAM: PORTABLE CHEST 1 VIEW COMPARISON:  08/23/2019 FINDINGS: Cardiac shadow is stable. Pacing device is again seen. Patchy airspace opacities are again identified slightly more marked when compared with the prior exam consistent with the given clinical history. No sizable effusion is seen. No bony abnormality is noted. IMPRESSION: Slight increase in airspace opacities when compared with the prior exam. Electronically Signed   By: Inez Catalina M.D.   On: 09/03/2019 10:37   DG Chest Port 1 View  Result Date: 08/15/2019 CLINICAL DATA:  Cough, recent COVID, hypoxia. EXAM: PORTABLE CHEST 1 VIEW COMPARISON:  Chest radiograph 08/16/2011. FINDINGS: Redemonstrated left chest dual lead pacer device. Heart size at the upper limits of normal. Aortic atherosclerosis. Again demonstrated is bilateral interstitial prominence and subtle ill-defined bilateral patchy pulmonary opacities. Likely scarring within the right mid to upper lung field. No evidence of pleural effusion or pneumothorax. No acute bony abnormality. IMPRESSION: Interstitial prominence with subtle bilateral patchy ill-defined pulmonary opacities. Findings are similar to prior examination 08/16/2019 and suspicious for atypical/viral pneumonia given provided history. Probable scarring within the right mid to upper lung. Heart size at the upper limits of normal. Electronically Signed   By: Kellie Simmering DO   On: 08/15/2019 15:59        Scheduled Meds: . atorvastatin  20 mg Oral Daily  . chlorhexidine  15 mL Mouth Rinse BID  . Chlorhexidine Gluconate Cloth  6 each Topical Daily  . hydrocortisone sod succinate (SOLU-CORTEF) inj  50 mg Intravenous Q12H  . mouth rinse  15 mL Mouth Rinse q12n4p  . potassium chloride  40 mEq Oral Q4H  . rOPINIRole  0.25 mg Oral QHS   Continuous Infusions: . ceFEPime (MAXIPIME) IV 2 g (09/03/19 4010)  .  sodium bicarbonate  (isotonic) infusion in sterile water 75 mL/hr at 09/03/19 0400     LOS: 2 days    Time spent: over 30 min    Fayrene Helper, MD [facility] Pager AMION  If 7PM-7AM, please contact night-coverage [email or web address] Password [email or web address] 09/03/2019, 2:25 PM

## 2019-09-04 DIAGNOSIS — R6520 Severe sepsis without septic shock: Secondary | ICD-10-CM

## 2019-09-04 LAB — CBC WITH DIFFERENTIAL/PLATELET
Abs Immature Granulocytes: 0.1 10*3/uL — ABNORMAL HIGH (ref 0.00–0.07)
Basophils Absolute: 0 10*3/uL (ref 0.0–0.1)
Basophils Relative: 0 %
Eosinophils Absolute: 0 10*3/uL (ref 0.0–0.5)
Eosinophils Relative: 0 %
HCT: 20.1 % — ABNORMAL LOW (ref 39.0–52.0)
Hemoglobin: 6.9 g/dL — CL (ref 13.0–17.0)
Immature Granulocytes: 1 %
Lymphocytes Relative: 5 %
Lymphs Abs: 0.4 10*3/uL — ABNORMAL LOW (ref 0.7–4.0)
MCH: 29.7 pg (ref 26.0–34.0)
MCHC: 34.3 g/dL (ref 30.0–36.0)
MCV: 86.6 fL (ref 80.0–100.0)
Monocytes Absolute: 0.6 10*3/uL (ref 0.1–1.0)
Monocytes Relative: 6 %
Neutro Abs: 7.6 10*3/uL (ref 1.7–7.7)
Neutrophils Relative %: 88 %
Platelets: 226 10*3/uL (ref 150–400)
RBC: 2.32 MIL/uL — ABNORMAL LOW (ref 4.22–5.81)
RDW: 14.8 % (ref 11.5–15.5)
WBC: 8.7 10*3/uL (ref 4.0–10.5)
nRBC: 0 % (ref 0.0–0.2)

## 2019-09-04 LAB — COMPREHENSIVE METABOLIC PANEL
ALT: 47 U/L — ABNORMAL HIGH (ref 0–44)
AST: 29 U/L (ref 15–41)
Albumin: 1.8 g/dL — ABNORMAL LOW (ref 3.5–5.0)
Alkaline Phosphatase: 62 U/L (ref 38–126)
Anion gap: 10 (ref 5–15)
BUN: 46 mg/dL — ABNORMAL HIGH (ref 8–23)
CO2: 21 mmol/L — ABNORMAL LOW (ref 22–32)
Calcium: 8 mg/dL — ABNORMAL LOW (ref 8.9–10.3)
Chloride: 109 mmol/L (ref 98–111)
Creatinine, Ser: 1.29 mg/dL — ABNORMAL HIGH (ref 0.61–1.24)
GFR calc Af Amer: 53 mL/min — ABNORMAL LOW (ref >60)
GFR calc non Af Amer: 46 mL/min — ABNORMAL LOW (ref >60)
Glucose, Bld: 135 mg/dL — ABNORMAL HIGH (ref 70–99)
Potassium: 3 mmol/L — ABNORMAL LOW (ref 3.5–5.1)
Sodium: 140 mmol/L (ref 135–145)
Total Bilirubin: 0.7 mg/dL (ref 0.3–1.2)
Total Protein: 4.3 g/dL — ABNORMAL LOW (ref 6.5–8.1)

## 2019-09-04 LAB — MAGNESIUM: Magnesium: 2.1 mg/dL (ref 1.7–2.4)

## 2019-09-04 LAB — PREPARE RBC (CROSSMATCH)

## 2019-09-04 LAB — TSH: TSH: 0.632 u[IU]/mL (ref 0.350–4.500)

## 2019-09-04 LAB — D-DIMER, QUANTITATIVE: D-Dimer, Quant: 1.19 ug/mL-FEU — ABNORMAL HIGH (ref 0.00–0.50)

## 2019-09-04 LAB — HEMOGLOBIN AND HEMATOCRIT, BLOOD
HCT: 26.6 % — ABNORMAL LOW (ref 39.0–52.0)
Hemoglobin: 9 g/dL — ABNORMAL LOW (ref 13.0–17.0)

## 2019-09-04 LAB — C-REACTIVE PROTEIN: CRP: 5.7 mg/dL — ABNORMAL HIGH (ref <1.0)

## 2019-09-04 LAB — PHOSPHORUS: Phosphorus: 1.9 mg/dL — ABNORMAL LOW (ref 2.5–4.6)

## 2019-09-04 LAB — FERRITIN: Ferritin: 949 ng/mL — ABNORMAL HIGH (ref 24–336)

## 2019-09-04 MED ORDER — FAMOTIDINE IN NACL 20-0.9 MG/50ML-% IV SOLN
20.0000 mg | INTRAVENOUS | Status: DC
  Administered 2019-09-04 – 2019-09-05 (×2): 20 mg via INTRAVENOUS
  Filled 2019-09-04 (×3): qty 50

## 2019-09-04 MED ORDER — DEXTROSE IN LACTATED RINGERS 5 % IV SOLN: INTRAVENOUS | Status: DC

## 2019-09-04 MED ORDER — THIAMINE HCL 100 MG/ML IJ SOLN
100.0000 mg | Freq: Every day | INTRAMUSCULAR | Status: DC
  Administered 2019-09-04 – 2019-09-05 (×2): 100 mg via INTRAVENOUS
  Filled 2019-09-04 (×3): qty 1

## 2019-09-04 MED ORDER — METHYLPREDNISOLONE SODIUM SUCC 40 MG IJ SOLR
40.0000 mg | Freq: Two times a day (BID) | INTRAMUSCULAR | Status: DC
  Administered 2019-09-04 – 2019-09-05 (×4): 40 mg via INTRAVENOUS
  Filled 2019-09-04 (×4): qty 1

## 2019-09-04 MED ORDER — POTASSIUM PHOSPHATES 15 MMOLE/5ML IV SOLN
20.0000 mmol | Freq: Once | INTRAVENOUS | Status: AC
  Administered 2019-09-04: 20 mmol via INTRAVENOUS
  Filled 2019-09-04: qty 6.67

## 2019-09-04 MED ORDER — SODIUM CHLORIDE 0.9% IV SOLUTION: Freq: Once | INTRAVENOUS | Status: AC

## 2019-09-04 MED ORDER — POTASSIUM CHLORIDE 10 MEQ/100ML IV SOLN
10.0000 meq | INTRAVENOUS | Status: AC
  Administered 2019-09-04 (×4): 10 meq via INTRAVENOUS
  Filled 2019-09-04 (×2): qty 100

## 2019-09-04 NOTE — Plan of Care (Signed)
Pt slept well during the night. Alert but oriented only to self. Answers simple yes/no questions. No complaints of pain verbalized, no signs of pain notes. Vitals stable on 4L [place]. Congested but non-productive cough noted, unable to suction any secretions orally.  Maintained NPO pending SLP reassessment. Dependent with ADLs. Regular repositioning and oral care ensured. IV fluids and IV antibiotics continued. Critical Hgb this AM - 6.9.  MD Robb Matar informed, 1 unit PRBC transfusion started. No other issues, will monitor.   Problem: Activity: Goal: Risk for activity intolerance will decrease Outcome: Progressing   Problem: Nutrition: Goal: Adequate nutrition will be maintained Outcome: Progressing   Problem: Coping: Goal: Level of anxiety will decrease Outcome: Progressing   Problem: Skin Integrity: Goal: Risk for impaired skin integrity will decrease Outcome: Progressing

## 2019-09-04 NOTE — Evaluation (Signed)
PT Cancellation Note  Patient Details Name: Tom Chavez MRN: [id number] DOB: 12/14/1918   Cancelled Treatment:    Reason Eval/Treat Not Completed: Patient's level of consciousness. Orders received and chart reviewed, attempted to see pt this am but he was not appropriate for participation in PT assessment.  Drema Pry, PT    [name] 09/04/2019, 12:31 PM

## 2019-09-04 NOTE — Progress Notes (Signed)
Removed foley catheter per order.  No complications noted.  Condom cath is now in place. Will bladder scan every 6 hours as ordered.

## 2019-09-04 NOTE — Progress Notes (Signed)
PROGRESS NOTE    Tom Chavez  WCH:[id number] DOB: [birth year] DOA: 08/09/2019 PCP: Monico Blitz, MD   Brief Narrative:  86 year old male with Bradd Merlos history of hypertension, hyperlipidemia, restless leg syndrome, symptomatic bradycardia status post PPM presenting from SNF with increasing generalized weakness, confusion.  The patient was recently admitted to the hospital from 08/16/2019 to 08/22/2019 during which time he was treated for acute respiratory failure secondary to COVID-19 pneumonia.  At the time of discharge, the patient was on room air with oxygen saturation 93-95%.  Apparently, the patient was noted to have oxygen saturation of 74% on 3 L at his nursing facility.  He was also hypothermic upon arrival to the emergency department.  Unfortunately, the patient is unable to provide any history given his encephalopathy. In the emergency department, the patient was afebrile hemodynamically stable albeit with soft blood pressures.  Oxygen saturation was 93-94% on 3 L.  BMP showed Krishay Faro serum creatinine of 2.39 with sodium 131 and potassium 3.4.  WBC 13.1, hemoglobin 10.5, platelets 259,000.  Chest x-ray showed subtle bilateral ill-defined pulmonary opacities.  Lactic acid peaked at 3.5.  The patient was started on cefepime and vancomycin for presumptive pneumonia.  Assessment & Plan:   Active Problems:   HTN (hypertension)   HLD (hyperlipidemia)   Restless leg syndrome   Sepsis (HCC)   LBBB (left bundle branch block)   AKI (acute kidney injury) ([place])   Hyponatremia   Bradycardia   Acute on chronic respiratory failure with hypoxia (HCC)   Metabolic acidosis   Severe sepsis (HCC)   Lobar pneumonia (HCC)   Acute renal failure superimposed on stage 3a chronic kidney disease ([place])  Goals of Care: discussed with daughter Reine Just) today, with AMS and hypoxia from COVID/HCAP.  He's unsafe to take PO right now due to his mental status.  Discussed if this continues, considering comfort  measures/hospice would be reasonable.  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  Will continue current plan of care for now.  Discussed will also consult palliative care to help with additional [place] conversations.    Acute Hypoxic Respiratory Failure 2/2 COVID 19 Virus Infection  Sepsis  Possible HCAP:  Treated for COVID from 12/13-12/19 and discharged on RA Readmitted on 12/29 with AHRF, met criteria for sepsis Currently requiring 4 L [place] Currently treating for possible HCAP with cefepime Elevated lactate, downtrending Procalcitonin 0.58 MRSA PCR negative, discontinue vancomycin Follow blood (ngtd x3) and urine culture (no growth) Will continue steroids for now.  Had 5 days remdesivir at previous hospitalization. CXR 12/31 with slight increase in airspace opacities  COVID-19 Labs  Recent Labs    09/03/19 0230 09/04/19 0015  DDIMER 1.38* 1.19*  FERRITIN 1,398* 949*  CRP 9.1* 5.7*    No results found for: [id number]  Acute Metabolic Encephalopathy: 2/2 above, follow closely.  He's more confused today, less able to engage in conversation.  Does not currently appear safe to swallow per nursing.  NPO for now, will have speech reevaluate.  B12 and folate wnl.  Follow TSH.  Acute on chronic renal failure--CKD stage III  Non Anion Gap Metabolic Acidosis -Baseline creatinine 1.0-1.3 -Presenting serum creatinine 2.74 -Creatinine improving today -NAGMA improved, follow on LR  Hypokalemia: replace and follow  Hypophosphatemia: replace and follow  Hyponatremia -Secondary to volume -improved  Hyperlipidemia -Continue statin  LBBB - noted  Hematuria: urine clear this AM, follow  Anemia  Iron Deficiency: suspect dilutional with IVF.  Follow B12, folate, iron panel (suggestive of iron deficiency and chronic disease).  S/p 2 unit  pRBC, trend H/H  Foley in place: will discuss indication with RN  Elevated LFT's: improved  DVT prophylaxis: SCD Code Status: DNR Family Communication: none at bedside - discussed with daughter over phone 09/04/19 Disposition Plan: pending further improvemnet   Consultants:   none  Procedures:  none  Antimicrobials:  Anti-infectives (From admission, onward)   Start     Dose/Rate Route Frequency Ordered Stop   09/03/19 2200  vancomycin (VANCOCIN) IVPB 1000 mg/200 mL premix  Status:  Discontinued     1,000 mg 200 mL/hr over 60 Minutes Intravenous Every 48 hours 09/02/19 1352 09/03/19 0821   09/03/19 0600  ceFEPIme (MAXIPIME) 2 g in sodium chloride 0.9 % 100 mL IVPB     2 g 200 mL/hr over 30 Minutes Intravenous Every 24 hours 09/02/19 1342     09/02/19 1800  vancomycin (VANCOREADY) IVPB 750 mg/150 mL  Status:  Discontinued     750 mg 150 mL/hr over 60 Minutes Intravenous Every 24 hours 08/09/2019 1541 09/02/19 1339   09/02/19 0600  ceFEPIme (MAXIPIME) 2 g in sodium chloride 0.9 % 100 mL IVPB  Status:  Discontinued     2 g 200 mL/hr over 30 Minutes Intravenous Every 12 hours 08/16/2019 1538 09/02/19 1342   08/15/2019 1600  vancomycin (VANCOREADY) IVPB 1500 mg/300 mL     1,500 mg 150 mL/hr over 120 Minutes Intravenous  Once 08/28/2019 1539 09/02/19 0829   08/13/2019 1545  vancomycin (VANCOCIN) IVPB 1000 mg/200 mL premix  Status:  Discontinued     1,000 mg 200 mL/hr over 60 Minutes Intravenous  Once 08/19/2019 1537 08/24/2019 1539   09/02/2019 1545  ceFEPIme (MAXIPIME) 2 g in sodium chloride 0.9 % 100 mL IVPB     2 g 200 mL/hr over 30 Minutes Intravenous  Once 08/23/2019 1537 08/21/2019 1634     Subjective: Unable to engage in meaningful conversation Shakes head no when asked if he has pain or discomfort  Objective: Vitals:   09/04/19 0555 09/04/19 0733 09/04/19 0845 09/04/19 1130  BP: (!) 122/54 (!) 120/57 (!) 108/49 (!) 124/52  Pulse: 62 94 93 89  Resp: (!) '24 20 18 20  '$ Temp: 97.8 F  (36.6 C) 98 F (36.7 C) 98.3 F (36.8 C) 98.8 F (37.1 C)  TempSrc: Axillary Oral Oral Axillary  SpO2: 95% 91%  96%  Weight:      Height:        Intake/Output Summary (Last 24 hours) at 09/04/2019 1526 Last data filed at 09/04/2019 0845 Gross per 24 hour  Intake 2283.42 ml  Output 1625 ml  Net 658.42 ml   Filed Weights   08/30/2019 1527  Weight: 65.8 kg    Examination:  General: No acute distress. Cardiovascular: RRR Lungs: Clear to auscultation bilaterally  Abdomen: Soft, nontender, nondistended Neurological:  Disoriented, lethargic. Moves all extremities 4. Cranial nerves II through XII grossly intact. Skin: Warm and dry. No rashes or lesions. Extremities: No clubbing or cyanosis. No edema.    Data Reviewed: I have personally reviewed following labs and imaging studies  CBC: Recent Labs  Lab 08/13/2019 1549 09/02/19 1250 09/03/19 0230 09/03/19 1558 09/04/19 0015  WBC 13.1* 9.2 9.0  --  8.7  NEUTROABS 12.1* 8.7* 8.3*  --  7.6  HGB 8.4* 6.5* 7.3* 7.4*  6.9*  HCT 25.7* 20.4* 21.7* 21.8* 20.1*  MCV 88.3 88.3 86.5  --  86.6  PLT 259 222 219  --  656   Basic Metabolic Panel: Recent Labs  Lab 08/20/2019 1549 09/02/19 0436 09/03/19 0230 09/03/19 1558 09/04/19 0015  NA 127* 131* 137 139 140  K 4.3 3.4* 2.6* 3.0* 3.0*  CL 95* 104 108 107 109  CO2 15* 14* 17* 21* 21*  GLUCOSE 199* 122* 113* 116* 135*  BUN 86* 81* 65* 52* 46*  CREATININE 2.74* 2.39* 1.82* 1.45* 1.29*  CALCIUM 8.7* 7.8* 8.1* 8.1* 8.0*  MG  --   --  2.2  --  2.1  PHOS  --   --  3.3  --  1.9*   GFR: Estimated Creatinine Clearance: 29.8 mL/min (Kalonji Zurawski) (by C-G formula based on SCr of 1.29 mg/dL (H)). Liver Function Tests: Recent Labs  Lab 08/14/2019 1549 09/03/19 0230 09/04/19 0015  AST 63* 36 29  ALT 110* 59* 47*  ALKPHOS 88 67 62  BILITOT 1.1 0.9 0.7  PROT 6.1* 4.7* 4.3*  ALBUMIN 2.4* 2.0* 1.8*   No results for input(s): LIPASE, AMYLASE in the last 168 hours. No results for input(s): AMMONIA  in the last 168 hours. Coagulation Profile: Recent Labs  Lab 08/31/2019 1549 09/02/19 0436  INR 1.3* 1.5*   Cardiac Enzymes: No results for input(s): CKTOTAL, CKMB, CKMBINDEX, TROPONINI in the last 168 hours. BNP (last 3 results) No results for input(s): PROBNP in the last 8760 hours. HbA1C: No results for input(s): HGBA1C in the last 72 hours. CBG: No results for input(s): GLUCAP in the last 168 hours. Lipid Profile: No results for input(s): CHOL, HDL, LDLCALC, TRIG, CHOLHDL, LDLDIRECT in the last 72 hours. Thyroid Function Tests: No results for input(s): TSH, T4TOTAL, FREET4, T3FREE, THYROIDAB in the last 72 hours. Anemia Panel: Recent Labs    09/03/19 0230 09/03/19 1558 09/04/19 0015  VITAMINB12  --  1,463*  --   FOLATE  --  7.2  --   FERRITIN 1,398*  --  949*  TIBC  --  134*  --   IRON  --  31*  --    Sepsis Labs: Recent Labs  Lab 09/02/2019 1549 08/18/2019 1745 08/19/2019 2126 09/02/19 0436  PROCALCITON  --   --   --  0.58  LATICACIDVEN 3.5* 3.5* 2.4*  --     Recent Results (from the past 240 hour(s))  Blood Culture (routine x 2)     Status: None (Preliminary result)   Collection Time: 08/07/2019  3:49 PM   Specimen: Right Antecubital; Blood  Result Value Ref Range Status   Specimen Description RIGHT ANTECUBITAL  Final   Special Requests   Final    BOTTLES DRAWN AEROBIC AND ANAEROBIC Blood Culture adequate volume   Culture   Final    NO GROWTH 3 DAYS Performed at [facility], [address] [place], [place] [address]    Report Status PENDING  Incomplete  Blood Culture (routine x 2)     Status: None (Preliminary result)   Collection Time: 08/31/2019  3:50 PM   Specimen: BLOOD RIGHT HAND  Result Value Ref Range Status   Specimen Description BLOOD RIGHT HAND  Final   Special Requests   Final    BOTTLES DRAWN AEROBIC AND ANAEROBIC Blood Culture adequate volume   Culture   Final    NO GROWTH 3 DAYS Performed at [facility], [address], [place],   [address]    Report  Status PENDING  Incomplete  Urine culture     Status: None   Collection Time: 08/11/2019  4:45 PM   Specimen: In/Out Cath Urine  Result Value Ref Range Status   Specimen Description   Final    IN/OUT CATH URINE Performed at [facility], [address] [place], [place] [address]    Special Requests   Final    NONE Performed at [facility], [address], [place], [place] [address]    Culture   Final    NO GROWTH Performed at [facility], [place] [address], [place], [place] [address]    Report Status 09/02/2019 FINAL  Final  MRSA PCR Screening     Status: None   Collection Time: 09/02/19  1:37 PM   Specimen: Nasopharyngeal  Result Value Ref Range Status   MRSA by PCR NEGATIVE NEGATIVE Final    Comment:        The GeneXpert MRSA Assay (FDA approved for NASAL specimens only), is one component of Kiarrah Rausch comprehensive MRSA colonization surveillance program. It is not intended to diagnose MRSA infection nor to guide or monitor treatment for MRSA infections. Performed at [facility], [place] [address], [place], [place] [address]          Radiology Studies: DG CHEST PORT 1 VIEW  Result Date: 09/03/2019 CLINICAL DATA:  Hypoxia, history of COVID-19 positivity. EXAM: PORTABLE CHEST 1 VIEW COMPARISON:  08/16/2019 FINDINGS: Cardiac shadow is stable. Pacing device is again seen. Patchy airspace opacities are again identified slightly more marked when compared with the prior exam consistent with the given clinical history. No sizable effusion is seen. No bony abnormality is noted. IMPRESSION: Slight increase in airspace opacities when compared with the prior exam. Electronically Signed   By: Inez Catalina M.D.   On: 09/03/2019 10:37        Scheduled Meds: . atorvastatin  20 mg Oral Daily  . chlorhexidine  15 mL Mouth Rinse BID  . Chlorhexidine Gluconate Cloth  6 each Topical Daily  . mouth rinse  15 mL Mouth Rinse q12n4p  . methylPREDNISolone (SOLU-MEDROL)  injection  40 mg Intravenous Q12H  . rOPINIRole  0.25 mg Oral QHS   Continuous Infusions: . ceFEPime (MAXIPIME) IV Stopped (09/04/19 0545)  . dextrose 5% lactated ringers Stopped (09/04/19 0514)  . famotidine (PEPCID) IV 20 mg (09/04/19 0933)     LOS: 3 days    Time spent: over 41 min    Fayrene Helper, MD [facility] Pager AMION  If 7PM-7AM, please contact night-coverage [email or web address] Password [facility] 09/04/2019, 3:26 PM

## 2019-09-04 DEATH — deceased

## 2019-09-05 ENCOUNTER — Inpatient Hospital Stay (HOSPITAL_COMMUNITY): Payer: Medicare Other

## 2019-09-05 LAB — TYPE AND SCREEN
ABO/RH(D): A POS
Antibody Screen: NEGATIVE
Unit division: 0
Unit division: 0

## 2019-09-05 LAB — COMPREHENSIVE METABOLIC PANEL
ALT: 44 U/L (ref 0–44)
AST: 25 U/L (ref 15–41)
Albumin: 2.1 g/dL — ABNORMAL LOW (ref 3.5–5.0)
Alkaline Phosphatase: 73 U/L (ref 38–126)
Anion gap: 11 (ref 5–15)
BUN: 33 mg/dL — ABNORMAL HIGH (ref 8–23)
CO2: 21 mmol/L — ABNORMAL LOW (ref 22–32)
Calcium: 8.2 mg/dL — ABNORMAL LOW (ref 8.9–10.3)
Chloride: 107 mmol/L (ref 98–111)
Creatinine, Ser: 1.08 mg/dL (ref 0.61–1.24)
GFR calc Af Amer: >60 mL/min (ref >60)
GFR calc non Af Amer: 57 mL/min — ABNORMAL LOW (ref >60)
Glucose, Bld: 202 mg/dL — ABNORMAL HIGH (ref 70–99)
Potassium: 4 mmol/L (ref 3.5–5.1)
Sodium: 139 mmol/L (ref 135–145)
Total Bilirubin: 0.9 mg/dL (ref 0.3–1.2)
Total Protein: 4.9 g/dL — ABNORMAL LOW (ref 6.5–8.1)

## 2019-09-05 LAB — CBC WITH DIFFERENTIAL/PLATELET
Abs Immature Granulocytes: 0.13 10*3/uL — ABNORMAL HIGH (ref 0.00–0.07)
Basophils Absolute: 0 10*3/uL (ref 0.0–0.1)
Basophils Relative: 0 %
Eosinophils Absolute: 0 10*3/uL (ref 0.0–0.5)
Eosinophils Relative: 0 %
HCT: 26.2 % — ABNORMAL LOW (ref 39.0–52.0)
Hemoglobin: 8.8 g/dL — ABNORMAL LOW (ref 13.0–17.0)
Immature Granulocytes: 1 %
Lymphocytes Relative: 3 %
Lymphs Abs: 0.3 10*3/uL — ABNORMAL LOW (ref 0.7–4.0)
MCH: 29.8 pg (ref 26.0–34.0)
MCHC: 33.6 g/dL (ref 30.0–36.0)
MCV: 88.8 fL (ref 80.0–100.0)
Monocytes Absolute: 0.3 10*3/uL (ref 0.1–1.0)
Monocytes Relative: 2 %
Neutro Abs: 10.3 10*3/uL — ABNORMAL HIGH (ref 1.7–7.7)
Neutrophils Relative %: 94 %
Platelets: 216 10*3/uL (ref 150–400)
RBC: 2.95 MIL/uL — ABNORMAL LOW (ref 4.22–5.81)
RDW: 15 % (ref 11.5–15.5)
WBC: 11.1 10*3/uL — ABNORMAL HIGH (ref 4.0–10.5)
nRBC: 0 % (ref 0.0–0.2)

## 2019-09-05 LAB — BPAM RBC
Blood Product Expiration Date: 202101182359
Blood Product Expiration Date: 202101202359
ISSUE DATE / TIME: 202012302114
ISSUE DATE / TIME: 202101010350
Unit Type and Rh: 6200
Unit Type and Rh: 6200

## 2019-09-05 LAB — PROCALCITONIN: Procalcitonin: 0.17 ng/mL

## 2019-09-05 LAB — PHOSPHORUS: Phosphorus: 2.5 mg/dL (ref 2.5–4.6)

## 2019-09-05 LAB — HEMOGLOBIN A1C
Hgb A1c MFr Bld: 5.9 % — ABNORMAL HIGH (ref 4.8–5.6)
Mean Plasma Glucose: 122.63 mg/dL

## 2019-09-05 LAB — FERRITIN: Ferritin: 940 ng/mL — ABNORMAL HIGH (ref 24–336)

## 2019-09-05 LAB — GLUCOSE, CAPILLARY
Glucose-Capillary: 179 mg/dL — ABNORMAL HIGH (ref 70–99)
Glucose-Capillary: 166 mg/dL — ABNORMAL HIGH (ref 70–99)

## 2019-09-05 LAB — D-DIMER, QUANTITATIVE: D-Dimer, Quant: 1.82 ug/mL-FEU — ABNORMAL HIGH (ref 0.00–0.50)

## 2019-09-05 LAB — C-REACTIVE PROTEIN: CRP: 6.1 mg/dL — ABNORMAL HIGH (ref <1.0)

## 2019-09-05 LAB — MAGNESIUM: Magnesium: 1.8 mg/dL (ref 1.7–2.4)

## 2019-09-05 MED ORDER — FUROSEMIDE 10 MG/ML IJ SOLN
20.0000 mg | Freq: Once | INTRAMUSCULAR | Status: AC
  Administered 2019-09-05: 20 mg via INTRAVENOUS
  Filled 2019-09-05: qty 2

## 2019-09-05 MED ORDER — TAMSULOSIN HCL 0.4 MG PO CAPS: 0.4000 mg | ORAL_CAPSULE | Freq: Every day | ORAL | Status: DC

## 2019-09-05 MED ORDER — INSULIN ASPART 100 UNIT/ML ~~LOC~~ SOLN
0.0000 [IU] | SUBCUTANEOUS | Status: DC
  Administered 2019-09-05: 2 [IU] via SUBCUTANEOUS
  Administered 2019-09-06: 1 [IU] via SUBCUTANEOUS

## 2019-09-05 MED ORDER — LORAZEPAM 2 MG/ML IJ SOLN
0.5000 mg | INTRAMUSCULAR | Status: DC | PRN
  Administered 2019-09-06: 0.5 mg via INTRAVENOUS
  Filled 2019-09-05 (×2): qty 1

## 2019-09-05 MED ORDER — MORPHINE SULFATE (PF) 2 MG/ML IV SOLN
2.0000 mg | INTRAVENOUS | Status: DC | PRN
  Administered 2019-09-05 – 2019-09-06 (×2): 2 mg via INTRAVENOUS
  Filled 2019-09-05 (×2): qty 1

## 2019-09-05 NOTE — Progress Notes (Signed)
Pharmacy Antibiotic Note  Tom Chavez is a 86 y.o. male admitted on 09/02/2019 with pneumonia.   Continues on Cefepime - Day 4   Plan: Continue Cefepime 2 grams iv Q 24 hours Pharmacy to sign off - consider stopping soon?  Height: 5\' 10"  (177.8 cm) Weight: 145 lb 1 oz (65.8 kg) IBW/kg (Calculated) : 73  Temp (24hrs), Avg:99 F (37.2 C), Min:98.8 F (37.1 C), Max:99.1 F (37.3 C)  Recent Labs  Lab 08/18/2019 1549 08/23/2019 1745 09/03/2019 2126 09/02/19 0436 09/02/19 1250 09/03/19 0230 09/03/19 1558 09/04/19 0015 09/05/19 0513  WBC 13.1*  --   --   --  9.2 9.0  --  8.7 11.1*  CREATININE 2.74*  --   --  2.39*  --  1.82* 1.45* 1.29* 1.08  LATICACIDVEN 3.5* 3.5* 2.4*  --   --   --   --   --   --     Estimated Creatinine Clearance: 35.5 mL/min (by C-G formula based on SCr of 1.08 mg/dL).    No Known Allergies   Thank you for allowing pharmacy to be a part of this patient's care.  11/03/19 09/05/2019 12:09 PM

## 2019-09-05 NOTE — Progress Notes (Signed)
RT called to see pt d/t desat.  RT arrived and pt gurgling audibly.  RT attepmted NTS but met resistance and blood.  RT used suction to remove moderate amount from pt's throat.  Pt on 100% NRB and 115LPM [place].  Stas increased to 89 while RT present but then dropped again to lower 80s.  RT attempted suctioning throat again and sats improved but never went above 90.  RT will monitor.

## 2019-09-05 NOTE — Progress Notes (Addendum)
PROGRESS NOTE    Tom Chavez  NLZ:[id number] DOB: 1919/08/20 DOA: 08/12/2019 PCP: Monico Blitz, MD   Brief Narrative:  86 year old male with Tom Chavez history of hypertension, hyperlipidemia, restless leg syndrome, symptomatic bradycardia status post PPM presenting from SNF with increasing generalized weakness, confusion.  The patient was recently admitted to the hospital from 08/16/2019 to 08/22/2019 during which time he was treated for acute respiratory failure secondary to COVID-19 pneumonia.  At the time of discharge, the patient was on room air with oxygen saturation 93-95%.  Apparently, the patient was noted to have oxygen saturation of 74% on 3 L at his nursing facility.  He was also hypothermic upon arrival to the emergency department.  Unfortunately, the patient is unable to provide any history given his encephalopathy. In the emergency department, the patient was afebrile hemodynamically stable albeit with soft blood pressures.  Oxygen saturation was 93-94% on 3 L.  BMP showed Elmarie Devlin serum creatinine of 2.39 with sodium 131 and potassium 3.4.  WBC 13.1, hemoglobin 10.5, platelets 259,000.  Chest x-ray showed subtle bilateral ill-defined pulmonary opacities.  Lactic acid peaked at 3.5.  The patient was started on cefepime and vancomycin for presumptive pneumonia.  Assessment & Plan:   Active Problems:   HTN (hypertension)   HLD (hyperlipidemia)   Restless leg syndrome   Sepsis (HCC)   LBBB (left bundle branch block)   AKI (acute kidney injury) ([place])   Hyponatremia   Bradycardia   Acute on chronic respiratory failure with hypoxia (HCC)   Metabolic acidosis   Severe sepsis (HCC)   Lobar pneumonia (HCC)   Acute renal failure superimposed on stage 3a chronic kidney disease ([place])  Goals of Care: discussed with daughter Tom Chavez) today, with AMS and hypoxia from COVID/HCAP.  He's unsafe to take PO right now due to his mental status.  Hypoxia has worsened today.  Discussed with Tom Chavez (daughter)  and wife, Tom Chavez, and discussed my concerns about his poor prognosis.  Tom Chavez was able to facetime him and he perked up Ramie Palladino bit during this conversation.  At this time will continue supportive care, but if he does not turn around soon or continues to worsen, comfort measures would be appropriate.  I encouraged Mrs. Moret to try to facetime if she's able.  Will continue goc conversations.  Palliative care has been consulted to assist as well.    Acute Hypoxic Respiratory Failure 2/2 COVID 19 Virus Infection  Sepsis  Possible HCAP:  Treated for COVID from 12/13-12/19 and discharged on RA Readmitted on 12/29 with AHRF, met criteria for sepsis O2 needs increased to 15 L HFNC  CXR 1/2 without change in bilateral pulm opacities Currently treating for possible HCAP with cefepime Elevated lactate, downtrending Procalcitonin 0.58 -> follow  MRSA PCR negative, discontinue vancomycin Follow blood (ngtd x4) and urine culture (no growth) Will continue steroids for now.  Had 5 days remdesivir at previous hospitalization. CXR 12/31 with slight increase in airspace opacities  COVID-19 Labs  Recent Labs    09/03/19 0230 09/04/19 0015 09/05/19 0513  DDIMER 1.38* 1.19* 1.82*  FERRITIN 1,398* 949* 940*  CRP 9.1* 5.7* 6.1*    No results found for: [id number]  Acute Metabolic Encephalopathy: 2/2 above, follow closely.  He's more confused today, less able to engage in conversation.  Does not currently appear safe to swallow per nursing.  NPO for now, will have speech reevaluate.  B12 and folate wnl.  Follow TSH (wnl).  Acute on chronic renal failure--CKD stage III  Non  Anion Gap Metabolic Acidosis -Baseline creatinine 1.0-1.3 -Presenting serum creatinine 2.74 -Creatinine improved -NAGMA improved, follow on LR  Hypokalemia: resolved  Hypophosphatemia: resolved  Hyponatremia -Secondary to volume -improved  Hyperlipidemia -Continue statin  LBBB - noted  Hematuria: urine clear this AM,  follow  Anemia  Iron Deficiency: suspect dilutional with IVF.  Follow B12, folate, iron panel (suggestive of iron deficiency and chronic disease).  S/p 2 unit pRBC, trend H/H.   Hb stable today  Acute Urinary Retention: foley replaced 1/1.  flomax when able to tolerate PO.    Elevated LFT's: improved  DVT prophylaxis: SCD Code Status: DNR Family Communication: none at bedside - discussed with daughter/wife over phone 09/05/19 Disposition Plan: pending further improvemnet   Consultants:   none  Procedures:  none  Antimicrobials:  Anti-infectives (From admission, onward)   Start     Dose/Rate Route Frequency Ordered Stop   09/03/19 2200  vancomycin (VANCOCIN) IVPB 1000 mg/200 mL premix  Status:  Discontinued     1,000 mg 200 mL/hr over 60 Minutes Intravenous Every 48 hours 09/02/19 1352 09/03/19 0821   09/03/19 0600  ceFEPIme (MAXIPIME) 2 g in sodium chloride 0.9 % 100 mL IVPB     2 g 200 mL/hr over 30 Minutes Intravenous Every 24 hours 09/02/19 1342 09/10/19 0559   09/02/19 1800  vancomycin (VANCOREADY) IVPB 750 mg/150 mL  Status:  Discontinued     750 mg 150 mL/hr over 60 Minutes Intravenous Every 24 hours 08/10/2019 1541 09/02/19 1339   09/02/19 0600  ceFEPIme (MAXIPIME) 2 g in sodium chloride 0.9 % 100 mL IVPB  Status:  Discontinued     2 g 200 mL/hr over 30 Minutes Intravenous Every 12 hours 08/17/2019 1538 09/02/19 1342   08/12/2019 1600  vancomycin (VANCOREADY) IVPB 1500 mg/300 mL     1,500 mg 150 mL/hr over 120 Minutes Intravenous  Once 08/09/2019 1539 09/02/19 0829   08/31/2019 1545  vancomycin (VANCOCIN) IVPB 1000 mg/200 mL premix  Status:  Discontinued     1,000 mg 200 mL/hr over 60 Minutes Intravenous  Once 08/16/2019 1537 08/07/2019 1539   08/31/2019 1545  ceFEPIme (MAXIPIME) 2 g in sodium chloride 0.9 % 100 mL IVPB     2 g 200 mL/hr over 30 Minutes Intravenous  Once 08/20/2019 1537 08/07/2019 1634     Subjective: Confused, unable to engage in meaningful conversation Does not  follow commands  Objective: Vitals:   09/04/19 1850 09/04/19 2000 09/05/19 0400 09/05/19 1000  BP: (!) 122/56 (!) 120/56 106/63 106/62  Chavez: 88 100 66 93  Resp: 20 (!) 24 (!) 25 (!) 26  Temp: 99 F (37.2 C) 99.1 F (37.3 C) 99 F (37.2 C)   TempSrc: Axillary Axillary Axillary   SpO2: 95% 94% 95% (!) 89%  Weight:      Height:        Intake/Output Summary (Last 24 hours) at 09/05/2019 1540 Last data filed at 09/05/2019 0624 Gross per 24 hour  Intake 2537.76 ml  Output 1200 ml  Net 1337.76 ml   Filed Weights   08/21/2019 1527  Weight: 65.8 kg    Examination:  General: No acute distress. Cardiovascular: RRR Lungs: Clear to auscultation bilaterally . Abdomen: Soft, nontender, nondistended  Neurological: Disoriented, lethargic. Moves all extremities 4 . Cranial nerves II through XII grossly intact. Skin: Warm and dry. No rashes or lesions. Extremities: No clubbing or cyanosis. No edema.   Data Reviewed: I have personally reviewed following labs and imaging studies  CBC: Recent Labs  Lab 08/25/2019 1549 09/02/19 1250 09/03/19 0230 09/03/19 1558 09/04/19 0015 09/04/19 1650 09/05/19 0513  WBC 13.1* 9.2 9.0  --  8.7  --  11.1*  NEUTROABS 12.1* 8.7* 8.3*  --  7.6  --  10.3*  HGB 8.4* 6.5* 7.3* 7.4* 6.9* 9.0* 8.8*  HCT 25.7* 20.4* 21.7* 21.8* 20.1* 26.6* 26.2*  MCV 88.3 88.3 86.5  --  86.6  --  88.8  PLT 259 222 219  --  226  --  161   Basic Metabolic Panel: Recent Labs  Lab 09/02/19 0436 09/03/19 0230 09/03/19 1558 09/04/19 0015 09/05/19 0513  NA 131* 137 139 140 139  K 3.4* 2.6* 3.0* 3.0* 4.0  CL 104 108 107 109 107  CO2 14* 17* 21* 21* 21*  GLUCOSE 122* 113* 116* 135* 202*  BUN 81* 65* 52* 46* 33*  CREATININE 2.39* 1.82* 1.45* 1.29* 1.08  CALCIUM 7.8* 8.1* 8.1* 8.0* 8.2*  MG  --  2.2  --  2.1 1.8  PHOS  --  3.3  --  1.9* 2.5   GFR: Estimated Creatinine Clearance: 35.5 mL/min (by C-G formula based on SCr of 1.08 mg/dL). Liver Function Tests: Recent  Labs  Lab 08/30/2019 1549 09/03/19 0230 09/04/19 0015 09/05/19 0513  AST 63* 36 29 25  ALT 110* 59* 47* 44  ALKPHOS 88 67 62 73  BILITOT 1.1 0.9 0.7 0.9  PROT 6.1* 4.7* 4.3* 4.9*  ALBUMIN 2.4* 2.0* 1.8* 2.1*   No results for input(s): LIPASE, AMYLASE in the last 168 hours. No results for input(s): AMMONIA in the last 168 hours. Coagulation Profile: Recent Labs  Lab 08/17/2019 1549 09/02/19 0436  INR 1.3* 1.5*   Cardiac Enzymes: No results for input(s): CKTOTAL, CKMB, CKMBINDEX, TROPONINI in the last 168 hours. BNP (last 3 results) No results for input(s): PROBNP in the last 8760 hours. HbA1C: No results for input(s): HGBA1C in the last 72 hours. CBG: No results for input(s): GLUCAP in the last 168 hours. Lipid Profile: No results for input(s): CHOL, HDL, LDLCALC, TRIG, CHOLHDL, LDLDIRECT in the last 72 hours. Thyroid Function Tests: Recent Labs    09/04/19 0015  TSH 0.632   Anemia Panel: Recent Labs    09/03/19 1558 09/04/19 0015 09/05/19 0513  VITAMINB12 1,463*  --   --   FOLATE 7.2  --   --   FERRITIN  --  949* 940*  TIBC 134*  --   --   IRON 31*  --   --    Sepsis Labs: Recent Labs  Lab 08/06/2019 1549 08/28/2019 1745 08/26/2019 2126 09/02/19 0436  PROCALCITON  --   --   --  0.58  LATICACIDVEN 3.5* 3.5* 2.4*  --     Recent Results (from the past 240 hour(s))  Blood Culture (routine x 2)     Status: None (Preliminary result)   Collection Time: 08/23/2019  3:49 PM   Specimen: Right Antecubital; Blood  Result Value Ref Range Status   Specimen Description RIGHT ANTECUBITAL  Final   Special Requests   Final    BOTTLES DRAWN AEROBIC AND ANAEROBIC Blood Culture adequate volume   Culture   Final    NO GROWTH 4 DAYS Performed at [facility], [address] [place], [place] [address]    Report Status PENDING  Incomplete  Blood Culture (routine x 2)     Status: None (Preliminary result)   Collection Time: 08/07/2019  3:50 PM   Specimen: BLOOD RIGHT HAND  Result  Value Ref Range Status   Specimen Description BLOOD RIGHT HAND  Final   Special Requests   Final    BOTTLES DRAWN AEROBIC AND ANAEROBIC Blood Culture adequate volume   Culture   Final    NO GROWTH 4 DAYS Performed at [facility], [address], [place], [place] [address]    Report Status PENDING  Incomplete  Urine culture     Status: None   Collection Time: 08/08/2019  4:45 PM   Specimen: In/Out Cath Urine  Result Value Ref Range Status   Specimen Description   Final    IN/OUT CATH URINE Performed at [facility], [address], [place], [place] [address]    Special Requests   Final    NONE Performed at [facility], [address] [place], [place] [address]    Culture   Final    NO GROWTH Performed at [facility], [place] [address] [place], [place] [address]    Report Status 09/02/2019 FINAL  Final  MRSA PCR Screening     Status: None   Collection Time: 09/02/19  1:37 PM   Specimen: Nasopharyngeal  Result Value Ref Range Status   MRSA by PCR NEGATIVE NEGATIVE Final    Comment:        The GeneXpert MRSA Assay (FDA approved for NASAL specimens only), is one component of Dwane Andres comprehensive MRSA colonization surveillance program. It is not intended to diagnose MRSA infection nor to guide or monitor treatment for MRSA infections. Performed at [facility], [place] [address], [place], [place] [address]          Radiology Studies: DG CHEST PORT 1 VIEW  Result Date: 09/05/2019 CLINICAL DATA:  Hypoxia. EXAM: PORTABLE CHEST 1 VIEW COMPARISON:  09/03/2019 FINDINGS: Left chest wall pacer device is noted with leads in the right atrial appendage and right ventricle. Normal heart size. Small left pleural effusion. Diffuse bilateral pulmonary opacities are unchanged. IMPRESSION: 1. No change in bilateral pulmonary opacities and left pleural effusion. Electronically Signed   By: Kerby Moors M.D.   On: 09/05/2019 14:59        Scheduled Meds: . atorvastatin  20 mg Oral  Daily  . chlorhexidine  15 mL Mouth Rinse BID  . Chlorhexidine Gluconate Cloth  6 each Topical Daily  . mouth rinse  15 mL Mouth Rinse q12n4p  . methylPREDNISolone (SOLU-MEDROL) injection  40 mg Intravenous Q12H  . rOPINIRole  0.25 mg Oral QHS  . thiamine injection  100 mg Intravenous Daily   Continuous Infusions: . ceFEPime (MAXIPIME) IV 2 g (09/05/19 0624)  . dextrose 5% lactated ringers 75 mL/hr at 09/05/19 0108  . famotidine (PEPCID) IV 20 mg (09/05/19 0753)     LOS: 4 days    Time spent: over 41 min    Fayrene Helper, MD [facility] Pager AMION  If 7PM-7AM, please contact night-coverage [email or web address] Password [facility] 09/05/2019, 3:40 PM

## 2019-09-05 NOTE — Progress Notes (Addendum)
Nursing rounds completed. Patient found in respiratory distress. Wet lung sounds auscultated without stethascope. On call provider notified. Maintenance fluids stopped. RT present at bedside performing deep suction. CC RN Maisie Fus (RN ICU) at bedside.   MD notification 09/05/19 @ 2202: Maisie Fus, staff RN ICU pages on call provider (Dr. Vania Rea) x 2 for updates in patient care.   MD notification 09/05/19 @ 2207: Second page placed to on call provider (Dr. Vania Rea) r/t respiratory distress and need for chest x ray and diruetics (bumex vs. Lasix). Awaiting return phone call and orders. On call [facility] notified by Maisie Fus, staff ICU RN   MD notification 09/04/18 @ 2216: Third page placed to on call provider (Dr. Vania Rea) r/t to continued respiratory distress and need for portable chest  X ray, diruetics, and prn morphine. Awaiting for return phone call and orders   MD notification 09/05/19@ 2221: On call provider places orders for lasix 20 mg IV and Morphine 2 mg IV. Will execute orders

## 2019-09-05 NOTE — Progress Notes (Signed)
First page at 2215 for resp distress from RN, order for lasix placed.  No need to repeat imaging.  Second page at 2218 received.  Additional request for morphine prn.  Morphine order placed.

## 2019-09-06 LAB — CBC WITH DIFFERENTIAL/PLATELET
Abs Immature Granulocytes: 0.07 K/uL (ref 0.00–0.07)
Basophils Absolute: 0 K/uL (ref 0.0–0.1)
Basophils Relative: 0 %
Eosinophils Absolute: 0 K/uL (ref 0.0–0.5)
Eosinophils Relative: 0 %
HCT: 33.7 % — ABNORMAL LOW (ref 39.0–52.0)
Hemoglobin: 10.7 g/dL — ABNORMAL LOW (ref 13.0–17.0)
Immature Granulocytes: 1 %
Lymphocytes Relative: 3 %
Lymphs Abs: 0.3 K/uL — ABNORMAL LOW (ref 0.7–4.0)
MCH: 29.7 pg (ref 26.0–34.0)
MCHC: 31.8 g/dL (ref 30.0–36.0)
MCV: 93.6 fL (ref 80.0–100.0)
Monocytes Absolute: 0.3 K/uL (ref 0.1–1.0)
Monocytes Relative: 3 %
Neutro Abs: 8.5 K/uL — ABNORMAL HIGH (ref 1.7–7.7)
Neutrophils Relative %: 93 %
Platelets: 220 K/uL (ref 150–400)
RBC: 3.6 MIL/uL — ABNORMAL LOW (ref 4.22–5.81)
RDW: 15.9 % — ABNORMAL HIGH (ref 11.5–15.5)
WBC: 9.2 K/uL (ref 4.0–10.5)
nRBC: 0 % (ref 0.0–0.2)

## 2019-09-06 LAB — MAGNESIUM: Magnesium: 1.7 mg/dL (ref 1.7–2.4)

## 2019-09-06 LAB — COMPREHENSIVE METABOLIC PANEL WITH GFR
ALT: 35 U/L (ref 0–44)
AST: 27 U/L (ref 15–41)
Albumin: 1.9 g/dL — ABNORMAL LOW (ref 3.5–5.0)
Alkaline Phosphatase: 66 U/L (ref 38–126)
Anion gap: 13 (ref 5–15)
BUN: 35 mg/dL — ABNORMAL HIGH (ref 8–23)
CO2: 21 mmol/L — ABNORMAL LOW (ref 22–32)
Calcium: 8.6 mg/dL — ABNORMAL LOW (ref 8.9–10.3)
Chloride: 111 mmol/L (ref 98–111)
Creatinine, Ser: 1.4 mg/dL — ABNORMAL HIGH (ref 0.61–1.24)
GFR calc Af Amer: 48 mL/min — ABNORMAL LOW
GFR calc non Af Amer: 42 mL/min — ABNORMAL LOW
Glucose, Bld: 104 mg/dL — ABNORMAL HIGH (ref 70–99)
Potassium: 3.7 mmol/L (ref 3.5–5.1)
Sodium: 145 mmol/L (ref 135–145)
Total Bilirubin: 0.9 mg/dL (ref 0.3–1.2)
Total Protein: 4.6 g/dL — ABNORMAL LOW (ref 6.5–8.1)

## 2019-09-06 LAB — GLUCOSE, CAPILLARY
Glucose-Capillary: 132 mg/dL — ABNORMAL HIGH (ref 70–99)
Glucose-Capillary: 80 mg/dL (ref 70–99)
Glucose-Capillary: 85 mg/dL (ref 70–99)
Glucose-Capillary: 95 mg/dL (ref 70–99)

## 2019-09-06 LAB — PROCALCITONIN: Procalcitonin: 3.06 ng/mL

## 2019-09-06 LAB — FERRITIN: Ferritin: 1044 ng/mL — ABNORMAL HIGH (ref 24–336)

## 2019-09-06 LAB — PHOSPHORUS: Phosphorus: 2.5 mg/dL (ref 2.5–4.6)

## 2019-09-06 LAB — D-DIMER, QUANTITATIVE: D-Dimer, Quant: 3.5 ug{FEU}/mL — ABNORMAL HIGH (ref 0.00–0.50)

## 2019-09-06 LAB — C-REACTIVE PROTEIN: CRP: 10.1 mg/dL — ABNORMAL HIGH (ref <1.0)

## 2019-09-06 MED ORDER — NALOXONE HCL 0.4 MG/ML IJ SOLN: 0.4000 mg | Freq: Once | INTRAMUSCULAR | Status: AC

## 2019-09-06 MED ORDER — FUROSEMIDE 10 MG/ML IJ SOLN
20.0000 mg | Freq: Once | INTRAMUSCULAR | Status: DC
  Filled 2019-09-06: qty 2

## 2019-09-06 MED ORDER — LORAZEPAM 2 MG/ML IJ SOLN: 1.0000 mg | INTRAMUSCULAR | Status: DC | PRN

## 2019-09-06 MED ORDER — POLYVINYL ALCOHOL 1.4 % OP SOLN
1.0000 [drp] | Freq: Four times a day (QID) | OPHTHALMIC | Status: DC | PRN
  Filled 2019-09-06: qty 15

## 2019-09-06 MED ORDER — ONDANSETRON HCL 4 MG/2ML IJ SOLN: 4.0000 mg | Freq: Four times a day (QID) | INTRAMUSCULAR | Status: DC | PRN

## 2019-09-06 MED ORDER — GLYCOPYRROLATE 0.2 MG/ML IJ SOLN: 0.2000 mg | INTRAMUSCULAR | Status: DC | PRN

## 2019-09-06 MED ORDER — MORPHINE SULFATE (PF) 2 MG/ML IV SOLN
1.0000 mg | INTRAVENOUS | Status: DC | PRN
  Administered 2019-09-06: 1 mg via INTRAVENOUS
  Filled 2019-09-06: qty 1

## 2019-09-06 MED ORDER — ACETAMINOPHEN 325 MG PO TABS: 650.0000 mg | ORAL_TABLET | Freq: Four times a day (QID) | ORAL | Status: DC | PRN

## 2019-09-06 MED ORDER — HALOPERIDOL LACTATE 2 MG/ML PO CONC
0.5000 mg | ORAL | Status: DC | PRN
  Filled 2019-09-06: qty 0.3

## 2019-09-06 MED ORDER — LORAZEPAM 0.5 MG PO TABS: 1.0000 mg | ORAL_TABLET | ORAL | Status: DC | PRN

## 2019-09-06 MED ORDER — NALOXONE HCL 0.4 MG/ML IJ SOLN
INTRAMUSCULAR | Status: AC
  Administered 2019-09-06: 0.4 mg via INTRAVENOUS
  Filled 2019-09-06: qty 1

## 2019-09-06 MED ORDER — LORAZEPAM 2 MG/ML PO CONC: 1.0000 mg | ORAL | Status: DC | PRN

## 2019-09-06 MED ORDER — FUROSEMIDE 10 MG/ML IJ SOLN
20.0000 mg | Freq: Once | INTRAMUSCULAR | Status: AC
  Administered 2019-09-06: 20 mg via INTRAVENOUS

## 2019-09-06 MED ORDER — ONDANSETRON 4 MG PO TBDP: 4.0000 mg | ORAL_TABLET | Freq: Four times a day (QID) | ORAL | Status: DC | PRN

## 2019-09-06 MED ORDER — HALOPERIDOL 0.5 MG PO TABS
0.5000 mg | ORAL_TABLET | ORAL | Status: DC | PRN
  Filled 2019-09-06: qty 1

## 2019-09-06 MED ORDER — HALOPERIDOL LACTATE 5 MG/ML IJ SOLN: 0.5000 mg | INTRAMUSCULAR | Status: DC | PRN

## 2019-09-06 MED ORDER — MORPHINE SULFATE (PF) 2 MG/ML IV SOLN: 1.0000 mg | INTRAVENOUS | Status: DC | PRN

## 2019-09-06 MED ORDER — ACETAMINOPHEN 650 MG RE SUPP: 650.0000 mg | Freq: Four times a day (QID) | RECTAL | Status: DC | PRN

## 2019-09-06 MED ORDER — GLYCOPYRROLATE 1 MG PO TABS
1.0000 mg | ORAL_TABLET | ORAL | Status: DC | PRN
  Filled 2019-09-06: qty 1

## 2019-09-06 MED ORDER — BIOTENE DRY MOUTH MT LIQD: 15.0000 mL | OROMUCOSAL | Status: DC | PRN

## 2019-09-07 LAB — CULTURE, BLOOD (ROUTINE X 2)
Culture: NO GROWTH
Culture: NO GROWTH
Special Requests: ADEQUATE
Special Requests: ADEQUATE

## 2019-10-05 NOTE — Progress Notes (Signed)
Nursing rounds completed, pt presents with anxiety pt medicated with ativan, @ 0011 hrs.

## 2019-10-05 NOTE — Progress Notes (Signed)
Rapid Response Event Note  Overview:  Patient declining throughout day with low 02 sats, given morphine, narcan administered prior to my arrival.     Initial Focused Assessment: Minimally responsive male in bed, responding to painful stimuli by moving an uninteligible vocalizations. 02 sats in the 70s on arrival.    Interventions: Patient suctioned which produced minimal secretions. IV access obtained and patient given lasix.   Plan of Care (if not transferred): Per Dr. Onalee Hua care is not to be escalated beyond current measures  Event Summary:  Tom Chavez

## 2019-10-05 NOTE — Progress Notes (Signed)
Rt called d/t pt desat.  Rt entered to find pt on 15LPM [place] and 15LPM NRB with sats of 78%.  RT changed probe to pt's finger and all probes read around 80%.  RT will continue to monitor.

## 2019-10-05 NOTE — Progress Notes (Signed)
PROGRESS NOTE    Tom Chavez  JYN:[id number] DOB: [birth year] DOA: 08/10/2019 PCP: Monico Blitz, MD   Brief Narrative:  86 year old male with Jinna Weinman history of hypertension, hyperlipidemia, restless leg syndrome, symptomatic bradycardia status post PPM presenting from SNF with increasing generalized weakness, confusion.  The patient was recently admitted to the hospital from 08/16/2019 to 08/22/2019 during which time he was treated for acute respiratory failure secondary to COVID-19 pneumonia.  At the time of discharge, the patient was on room air with oxygen saturation 93-95%.  Apparently, the patient was noted to have oxygen saturation of 74% on 3 L at his nursing facility.  He was also hypothermic upon arrival to the emergency department.  Unfortunately, the patient is unable to provide any history given his encephalopathy. In the emergency department, the patient was afebrile hemodynamically stable albeit with soft blood pressures.  Oxygen saturation was 93-94% on 3 L.  BMP showed Sergey Ishler serum creatinine of 2.39 with sodium 131 and potassium 3.4.  WBC 13.1, hemoglobin 10.5, platelets 259,000.  Chest x-ray showed subtle bilateral ill-defined pulmonary opacities.  Lactic acid peaked at 3.5.  The patient was started on cefepime and vancomycin for presumptive pneumonia.  Assessment & Plan:   Active Problems:   HTN (hypertension)   HLD (hyperlipidemia)   Restless leg syndrome   Sepsis (HCC)   LBBB (left bundle branch block)   AKI (acute kidney injury) ([place])   Hyponatremia   Bradycardia   Acute on chronic respiratory failure with hypoxia (HCC)   Metabolic acidosis   Severe sepsis (HCC)   Lobar pneumonia (HCC)   Acute renal failure superimposed on stage 3a chronic kidney disease (HCC)  Goals of Care: Mr. Leeman has unfortunately continued to decline.  He's obtunded at this time, minimally responsive to me.  Last night had rapid response for respiratory distress.  Dr. Shanon Brow spoke with family and  recommended medications for comfort.  I spoke to both Mrs. Skeen and Mrs. Reine Just this AM.  Built upon discussion from yesterday where we discussed his decline and discussed events overnight.  Transitioning to comfort care at this time is appropriate in my opinion and Mrs. Araque and Mrs. Basto were agreeable to this plan of care.   Comfort meds ordered Will discuss with nursing to help family facetime if desired   Acute Hypoxic Respiratory Failure 2/2 COVID 19 Virus Infection  Sepsis  Possible HCAP:  Treated for COVID from 12/13-12/19 and discharged on RA Readmitted on 12/29 with AHRF, met criteria for sepsis O2 needs increased to 15 L HFNC  CXR 1/2 without change in bilateral pulm opacities Upper airway sounds noted yesterday, suspect he's aspirating, discussed this with daughter on 1/2.  Sounds like this was significant issue overnight as well requiring suctioning. Currently treating for possible HCAP with cefepime Elevated lactate, downtrending Procalcitonin 0.58 -> follow  MRSA PCR negative, discontinue vancomycin Follow blood (ngtd x4) and urine culture (no growth) Will continue steroids for now.  Had 5 days remdesivir at previous hospitalization. CXR 12/31 with slight increase in airspace opacities Comfort measures as noted above  COVID-19 Labs  Recent Labs    09/04/19 0015 09/05/19 0513 09/10/2019 0450  DDIMER 1.19* 1.82* 3.50*  FERRITIN 949* 940* 1,044*  CRP 5.7* 6.1* 10.1*    No results found for: [id number]  Acute Metabolic Encephalopathy:  Obtunded this morning.  Comfort measures as above   B12 and folate wnl.  Follow TSH (wnl).  Acute on chronic renal failure--CKD stage III  Non Anion  Gap Metabolic Acidosis -Baseline creatinine 1.0-1.3 -Presenting serum creatinine 2.74 -Creatinine improved -NAGMA improved, follow on LR - Comfort measures as above  Hypokalemia: resolved  Hypophosphatemia: resolved  Hyponatremia -Secondary to  volume -improved  Hyperlipidemia -Continue statin  LBBB - noted  Hematuria: urine clear this AM, follow  Anemia  Iron Deficiency: suspect dilutional with IVF.  Follow B12, folate, iron panel (suggestive of iron deficiency and chronic disease).  S/p 2 unit pRBC, trend H/H.   Hb stable today  Acute Urinary Retention: foley replaced 1/1.  flomax when able to tolerate PO.    Elevated LFT's: improved  DVT prophylaxis: SCD Code Status: DNR Family Communication: none at bedside - discussed with daughter/wife over phone 2019-09-20 Disposition Plan: pending further improvemnet   Consultants:   none  Procedures:  none  Antimicrobials:  Anti-infectives (From admission, onward)   Start     Dose/Rate Route Frequency Ordered Stop   09/03/19 2200  vancomycin (VANCOCIN) IVPB 1000 mg/200 mL premix  Status:  Discontinued     1,000 mg 200 mL/hr over 60 Minutes Intravenous Every 48 hours 09/02/19 1352 09/03/19 0821   09/03/19 0600  ceFEPIme (MAXIPIME) 2 g in sodium chloride 0.9 % 100 mL IVPB  Status:  Discontinued     2 g 200 mL/hr over 30 Minutes Intravenous Every 24 hours 09/02/19 1342 2019-09-20 0847   09/02/19 1800  vancomycin (VANCOREADY) IVPB 750 mg/150 mL  Status:  Discontinued     750 mg 150 mL/hr over 60 Minutes Intravenous Every 24 hours 08/12/2019 1541 09/02/19 1339   09/02/19 0600  ceFEPIme (MAXIPIME) 2 g in sodium chloride 0.9 % 100 mL IVPB  Status:  Discontinued     2 g 200 mL/hr over 30 Minutes Intravenous Every 12 hours 08/20/2019 1538 09/02/19 1342   08/09/2019 1600  vancomycin (VANCOREADY) IVPB 1500 mg/300 mL     1,500 mg 150 mL/hr over 120 Minutes Intravenous  Once 08/12/2019 1539 09/02/19 0829   08/26/2019 1545  vancomycin (VANCOCIN) IVPB 1000 mg/200 mL premix  Status:  Discontinued     1,000 mg 200 mL/hr over 60 Minutes Intravenous  Once 08/25/2019 1537 08/26/2019 1539   08/21/2019 1545  ceFEPIme (MAXIPIME) 2 g in sodium chloride 0.9 % 100 mL IVPB     2 g 200 mL/hr over 30 Minutes  Intravenous  Once 08/20/2019 1537 08/06/2019 1634     Subjective: Obtunded   Objective: Vitals:   09-20-2019 0429 09/20/2019 0443 20-Sep-2019 0500 09-20-19 0600  BP: (!) 91/37 108/73  112/74  Pulse: 85 96 99   Resp: (!) 38 (!) 31 (!) 33   Temp:      TempSrc:      SpO2:  (!) 76% (!) 85%   Weight:      Height:        Intake/Output Summary (Last 24 hours) at 09/20/2019 0847 Last data filed at 2019-09-20 0552 Gross per 24 hour  Intake --  Output 700 ml  Net -700 ml   Filed Weights   08/11/2019 1527  Weight: 65.8 kg    Examination:  General: No acute distress. Cardiovascular: RRR Lungs: upper airway sounds, coarse lung sounds Abdomen: Soft, nontender, nondistended  Neurological: minimally responsive, does not follow commands Skin: Warm and dry. No rashes or lesions. Extremities: No clubbing or cyanosis. No edema.   Data Reviewed: I have personally reviewed following labs and imaging studies  CBC: Recent Labs  Lab 09/02/19 1250 09/03/19 0230 09/03/19 1558 09/04/19 0015 09/04/19 1650 09/05/19 0513 2019/09/20  0450  WBC 9.2 9.0  --  8.7  --  11.1* 9.2  NEUTROABS 8.7* 8.3*  --  7.6  --  10.3* 8.5*  HGB 6.5* 7.3* 7.4* 6.9* 9.0* 8.8* 10.7*  HCT 20.4* 21.7* 21.8* 20.1* 26.6* 26.2* 33.7*  MCV 88.3 86.5  --  86.6  --  88.8 93.6  PLT 222 219  --  226  --  216 665   Basic Metabolic Panel: Recent Labs  Lab 09/03/19 0230 09/03/19 1558 09/04/19 0015 09/05/19 0513 Sep 16, 2019 0450  NA 137 139 140 139 145  K 2.6* 3.0* 3.0* 4.0 3.7  CL 108 107 109 107 111  CO2 17* 21* 21* 21* 21*  GLUCOSE 113* 116* 135* 202* 104*  BUN 65* 52* 46* 33* 35*  CREATININE 1.82* 1.45* 1.29* 1.08 1.40*  CALCIUM 8.1* 8.1* 8.0* 8.2* 8.6*  MG 2.2  --  2.1 1.8 1.7  PHOS 3.3  --  1.9* 2.5 2.5   GFR: Estimated Creatinine Clearance: 27.4 mL/min (Jackilyn Umphlett) (by C-G formula based on SCr of 1.4 mg/dL (H)). Liver Function Tests: Recent Labs  Lab 08/19/2019 1549 09/03/19 0230 09/04/19 0015 09/05/19 0513 09-16-19 0450   AST 63* 36 '29 25 27  '$ ALT 110* 59* 47* 44 35  ALKPHOS 88 67 62 73 66  BILITOT 1.1 0.9 0.7 0.9 0.9  PROT 6.1* 4.7* 4.3* 4.9* 4.6*  ALBUMIN 2.4* 2.0* 1.8* 2.1* 1.9*   No results for input(s): LIPASE, AMYLASE in the last 168 hours. No results for input(s): AMMONIA in the last 168 hours. Coagulation Profile: Recent Labs  Lab 08/25/2019 1549 09/02/19 0436  INR 1.3* 1.5*   Cardiac Enzymes: No results for input(s): CKTOTAL, CKMB, CKMBINDEX, TROPONINI in the last 168 hours. BNP (last 3 results) No results for input(s): PROBNP in the last 8760 hours. HbA1C: Recent Labs    09/05/19 1630  HGBA1C 5.9*   CBG: Recent Labs  Lab 09/05/19 1710 09/05/19 1954 09/05/19 2359 16-Sep-2019 0420 09/16/2019 0754  GLUCAP 179* 166* 132* 85 80   Lipid Profile: No results for input(s): CHOL, HDL, LDLCALC, TRIG, CHOLHDL, LDLDIRECT in the last 72 hours. Thyroid Function Tests: Recent Labs    09/04/19 0015  TSH 0.632   Anemia Panel: Recent Labs    09/03/19 1558 09/05/19 0513 September 16, 2019 0450  VITAMINB12 1,463*  --   --   FOLATE 7.2  --   --   FERRITIN  --  940* 1,044*  TIBC 134*  --   --   IRON 31*  --   --    Sepsis Labs: Recent Labs  Lab 08/29/2019 1549 08/05/2019 1745 08/20/2019 2126 09/02/19 0436 09/05/19 0600 2019/09/16 0450  PROCALCITON  --   --   --  0.58 0.17 3.06  LATICACIDVEN 3.5* 3.5* 2.4*  --   --   --     Recent Results (from the past 240 hour(s))  Blood Culture (routine x 2)     Status: None (Preliminary result)   Collection Time: 08/25/2019  3:49 PM   Specimen: Right Antecubital; Blood  Result Value Ref Range Status   Specimen Description RIGHT ANTECUBITAL  Final   Special Requests   Final    BOTTLES DRAWN AEROBIC AND ANAEROBIC Blood Culture adequate volume   Culture   Final    NO GROWTH 4 DAYS Performed at [facility], [address], [place], [place] [address]    Report Status PENDING  Incomplete  Blood Culture (routine x 2)     Status: None (Preliminary  result)    Collection Time: 08/25/2019  3:50 PM   Specimen: BLOOD RIGHT HAND  Result Value Ref Range Status   Specimen Description BLOOD RIGHT HAND  Final   Special Requests   Final    BOTTLES DRAWN AEROBIC AND ANAEROBIC Blood Culture adequate volume   Culture   Final    NO GROWTH 4 DAYS Performed at [facility], [address] [place], [place] [address]    Report Status PENDING  Incomplete  Urine culture     Status: None   Collection Time: 08/30/2019  4:45 PM   Specimen: In/Out Cath Urine  Result Value Ref Range Status   Specimen Description   Final    IN/OUT CATH URINE Performed at [facility], [address] [place], [place] [address]    Special Requests   Final    NONE Performed at [facility], [address] [place], [place] [address]    Culture   Final    NO GROWTH Performed at [facility], [place] [address] [place], [place] [address]    Report Status 09/02/2019 FINAL  Final  MRSA PCR Screening     Status: None   Collection Time: 09/02/19  1:37 PM   Specimen: Nasopharyngeal  Result Value Ref Range Status   MRSA by PCR NEGATIVE NEGATIVE Final    Comment:        The GeneXpert MRSA Assay (FDA approved for NASAL specimens only), is one component of [facility] comprehensive MRSA colonization surveillance program. It is not intended to diagnose MRSA infection nor to guide or monitor treatment for MRSA infections. Performed at [facility], [place] [address] [place], [place] [address]          Radiology Studies: DG CHEST PORT 1 VIEW  Result Date: 09/05/2019 CLINICAL DATA:  Hypoxia. EXAM: PORTABLE CHEST 1 VIEW COMPARISON:  09/03/2019 FINDINGS: Left chest wall pacer device is noted with leads in the right atrial appendage and right ventricle. Normal heart size. Small left pleural effusion. Diffuse bilateral pulmonary opacities are unchanged. IMPRESSION: 1. No change in bilateral pulmonary opacities and left pleural effusion. Electronically Signed   By: Kerby Moors M.D.   On:  09/05/2019 14:59        Scheduled Meds: . chlorhexidine  15 mL Mouth Rinse BID  . Chlorhexidine Gluconate Cloth  6 each Topical Daily  . mouth rinse  15 mL Mouth Rinse q12n4p   Continuous Infusions:    LOS: 5 days    Time spent: over 30 min    Fayrene Helper, MD [facility] Pager AMION  If 7PM-7AM, please contact night-coverage [email or web address] Password [facility] 09/09/2019, 8:47 AM

## 2019-10-05 NOTE — Progress Notes (Addendum)
@   0532 hrs Called and spoke with Pt spouse this AM regarding pt change in condition.

## 2019-10-05 NOTE — Progress Notes (Signed)
SLP Cancellation Note  Patient Details Name: Lucille Crichlow MRN: [id number] DOB: 27-Nov-1918   Cancelled treatment:       Reason Eval/Treat Not Completed: Other (comment). Given transition to comfort care will sign off   [place] Skalicky, Riley Nearing 02-Oct-2019, 3:01 PM

## 2019-10-05 NOTE — Care Plan (Addendum)
Pt became very sedated and unresponsive with minimal doses of morphine and ativan.  Responded to narcan.  Declining steadily all night with worsening hypoxia.  Per notes, likely will be comfort care.  Pt with extreme unlikelihood of surviving this hospitalization.  Recommend making total comfort in the am accepting risks of sedatives for benefit of comfort in the am.   Attempted to call daughter kim, no answer.    Got ahold of dtr kim via phone and updated about how much worse he is thru the night and the dangers of using morphine and ativan in the setting of hypoxia.  She is agreeable to use these meds in low doses to make him more comfortable.  She is going to call her mom and sister and speak to them and decide whether to make him full comfort care.  I strongly advised making him comfort care only so we could use these meds more judiciously.  She would also like to arrange a facetime visit, offered face to face visit but sounds like they all will defer that and do facetime instead if possible.  Daytime doctor to call in an hour or so to get their final decision on moving forward to making full comfort.  For now continue current orders.

## 2019-10-05 NOTE — Progress Notes (Signed)
Post mortem care has been performed,. Pt eyeglasses and watch are with the body.

## 2019-10-05 NOTE — Progress Notes (Signed)
Spoke with Dr. Onalee Hua @ 2011 hrs to inform her that this pts expired at 1953 hrs and to come pronounce the dead and to call the family.  Dr. Onalee Hua said for 2 RN's to pronounce the pt dead and to call the family as well with the news.

## 2019-10-05 NOTE — Progress Notes (Signed)
Pt resting in bed. NRB and HF [place] [facility]. Unable to maintain sats above 82%, with average sat at 77%. Pt is unresponsive and mental status has greatly declined from yesterday. BP have declined, and maintain a MAP in the 50's. Dr. Lowell Guitar has been made aware and states he will be on the unit shortly. Will continue to monitor status of patient.

## 2019-10-05 NOTE — Progress Notes (Signed)
Pt steadily desat and  became unresponsive. Pt suctioned via nose and mouth. MD on call made aware, pt treated with narcan and lasix. Spoke with pts wife at 0532 hrs, with update

## 2019-10-05 NOTE — Death Summary Note (Signed)
DEATH SUMMARY   Patient Details  Name: Tom Chavez MRN: [id number] DOB: May 04, 1919  Admission/Discharge Information   Admit Date:  September 21, 2019  Date of Death: Date of Death: 09/26/2019  Time of Death: Time of Death: [birth year] 03, 1950  Length of Stay: 5  Referring Physician: Kirstie Peri, MD   Reason(s) for Hospitalization  COVID 19 pneumonia  Diagnoses  Preliminary cause of death:   COVID 2023/01/10 pneumonia HCAP Secondary Diagnoses (including complications and co-morbidities):  Active Problems:   HTN (hypertension)   HLD (hyperlipidemia)   Restless leg syndrome   Sepsis (HCC)   LBBB (left bundle branch block)   AKI (acute kidney injury) (HCC)   Hyponatremia   Bradycardia   Acute on chronic respiratory failure with hypoxia (HCC)   Metabolic acidosis   Severe sepsis (HCC)   Lobar pneumonia (HCC)   Acute renal failure superimposed on stage 3a chronic kidney disease [facility])   Brief [facility] Course (including significant findings, care, treatment, and services provided and events leading to death)  86 year old male with [facility] history of hypertension, hyperlipidemia, restless leg syndrome, symptomatic bradycardia status post PPM presenting fromSNFwith increasing generalized weakness, confusion. The patient was recently admitted to the [facility] from 08/16/2019 to 08/22/2019 during which time he was treated for acute respiratory failure secondary to COVID-19 pneumonia. At the time of discharge, the patient was on room air with oxygen saturation 93-95%. Apparently, the patient was noted to have oxygen saturation of 74% on 3 L at his nursing facility. He was also hypothermic upon arrival to the emergency department. Unfortunately, the patient is unable to provide any history given his encephalopathy.  In the emergency department, the patient was afebrile hemodynamically stable albeit with soft blood pressures. Oxygen saturation was 93-94% on 3 L. BMP showed Tom [facility]avez serum creatinine of 2.39 with sodium 131  and potassium 3.4. WBC 13.1, hemoglobin 10.5, platelets 259,000. Chest x-ray showed subtle bilateral ill-defined pulmonary opacities. Lactic acid peaked at 3.5. The patient was started on cefepime and vancomycin for presumptive pneumonia.  He was treated for COVID pneumonia with steroids and HCAP with antibiotics.  Despite this, he has worsening encephalopathy and hypoxia.  On 1/3 the decision was made to pursue comfort measures after Tom [facility] discussion with his wife and daughter.  He passed away on 1/3 at 67.    Pertinent Labs and Studies  Significant Diagnostic Studies DG CHEST PORT 1 VIEW  Result Date: 09/05/2019 CLINICAL DATA:  Hypoxia. EXAM: PORTABLE CHEST 1 VIEW COMPARISON:  09/03/2019 FINDINGS: Left chest wall pacer device is noted with leads in the right atrial appendage and right ventricle. Normal heart size. Small left pleural effusion. Diffuse bilateral pulmonary opacities are unchanged. IMPRESSION: 1. No change in bilateral pulmonary opacities and left pleural effusion. Electronically Signed   By: Tom Kell M.D.   On: 09/05/2019 14:59   DG CHEST PORT 1 VIEW  Result Date: 09/03/2019 CLINICAL DATA:  Hypoxia, history of COVID-19 positivity. EXAM: PORTABLE CHEST 1 VIEW COMPARISON:  21-Sep-2019 FINDINGS: Cardiac shadow is stable. Pacing device is again seen. Patchy airspace opacities are again identified slightly more marked when compared with the prior exam consistent with the given clinical history. No sizable effusion is seen. No bony abnormality is noted. IMPRESSION: Slight increase in airspace opacities when compared with the prior exam. Electronically Signed   By: Tom Clever M.D.   On: 09/03/2019 10:37   DG Chest Port 1 View  Result Date: 09/21/19 CLINICAL DATA:  Cough, recent COVID, hypoxia. EXAM: PORTABLE CHEST 1 VIEW COMPARISON:  Chest radiograph 08/16/2011. FINDINGS: Redemonstrated left chest dual lead pacer device. Heart size at the upper limits of normal. Aortic  atherosclerosis. Again demonstrated is bilateral interstitial prominence and subtle ill-defined bilateral patchy pulmonary opacities. Likely scarring within the right mid to upper lung field. No evidence of pleural effusion or pneumothorax. No acute bony abnormality. IMPRESSION: Interstitial prominence with subtle bilateral patchy ill-defined pulmonary opacities. Findings are similar to prior examination 08/16/2019 and suspicious for atypical/viral pneumonia given provided history. Probable scarring within the right mid to upper lung. Heart size at the upper limits of normal. Electronically Signed   By: Tom Chavez   On: 09/15/2019 15:59   DG Chest Port 1 View  Result Date: 08/16/2019 CLINICAL DATA:  Shortness of breath for 2 days, COVID-19 positivity. EXAM: PORTABLE CHEST 1 VIEW COMPARISON:  None. FINDINGS: Cardiac shadow is enlarged. Pacing device is noted. Lungs are well aerated bilaterally. Patchy parenchymal opacities are noted bilaterally consistent with atypical pneumonia. No sizable effusion is seen. No bony abnormality is noted. IMPRESSION: Patchy opacities bilaterally consistent with atypical/viral pneumonia. This is consistent with the patient's given clinical history of COVID-19 positivity. Electronically Signed   By: Tom Clever M.D.   On: 08/16/2019 18:01   VAS [place] LOWER EXTREMITY VENOUS (DVT)  Result Date: 08/22/2019  Lower Venous Study Indications: Edema.  Comparison Study: no prior Performing Technologist: Tom [facility]  Examination Guidelines: Tom [facility] complete evaluation includes B-mode imaging, spectral Doppler, color Doppler, and power Doppler as needed of all accessible portions of each vessel. Bilateral testing is considered an integral part of Tom [facility] complete examination. Limited examinations for reoccurring indications may be performed as noted.  +---------+---------------+---------+-----------+----------+--------------+ RIGHT     CompressibilityPhasicitySpontaneityPropertiesThrombus Aging +---------+---------------+---------+-----------+----------+--------------+ CFV      Full           Yes      Yes                                 +---------+---------------+---------+-----------+----------+--------------+ SFJ      Full                                                        +---------+---------------+---------+-----------+----------+--------------+ FV Prox  Full                                                        +---------+---------------+---------+-----------+----------+--------------+ FV Mid   Full                                                        +---------+---------------+---------+-----------+----------+--------------+ FV DistalFull                                                        +---------+---------------+---------+-----------+----------+--------------+ PFV      Full                                                        +---------+---------------+---------+-----------+----------+--------------+  POP      Full           Yes      Yes                                 +---------+---------------+---------+-----------+----------+--------------+ PTV      Full                                                        +---------+---------------+---------+-----------+----------+--------------+ PERO     Full                                                        +---------+---------------+---------+-----------+----------+--------------+   +---------+---------------+---------+-----------+----------+--------------+ LEFT     CompressibilityPhasicitySpontaneityPropertiesThrombus Aging +---------+---------------+---------+-----------+----------+--------------+ CFV      Full           Yes      Yes                                 +---------+---------------+---------+-----------+----------+--------------+ SFJ      Full                                                         +---------+---------------+---------+-----------+----------+--------------+ FV Prox  Full                                                        +---------+---------------+---------+-----------+----------+--------------+ FV Mid   Full                                                        +---------+---------------+---------+-----------+----------+--------------+ FV DistalFull                                                        +---------+---------------+---------+-----------+----------+--------------+ PFV      Full                                                        +---------+---------------+---------+-----------+----------+--------------+ POP      Full           Yes      Yes                                 +---------+---------------+---------+-----------+----------+--------------+  PTV      Full                                                        +---------+---------------+---------+-----------+----------+--------------+ PERO     Full                                                        +---------+---------------+---------+-----------+----------+--------------+     Summary: Right: There is no evidence of deep vein thrombosis in the lower extremity. No cystic structure found in the popliteal fossa. Left: There is no evidence of deep vein thrombosis in the lower extremity. No cystic structure found in the popliteal fossa.  *See table(s) above for measurements and observations. Electronically signed by Coral Else MD on 08/22/2019 at 4:41:37 PM.    Final     Microbiology Recent Results (from the past 240 hour(s))  Blood Culture (routine x 2)     Status: None   Collection Time: 08/07/2019  3:49 PM   Specimen: Right Antecubital; Blood  Result Value Ref Range Status   Specimen Description RIGHT ANTECUBITAL  Final   Special Requests   Final    BOTTLES DRAWN AEROBIC AND ANAEROBIC Blood Culture adequate volume   Culture   Final    NO  GROWTH 6 DAYS Performed at [facility], [address], [place], [place] [address]    Report Status 09/07/2019 FINAL  Final  Blood Culture (routine x 2)     Status: None   Collection Time: 08/27/2019  3:50 PM   Specimen: BLOOD RIGHT HAND  Result Value Ref Range Status   Specimen Description BLOOD RIGHT HAND  Final   Special Requests   Final    BOTTLES DRAWN AEROBIC AND ANAEROBIC Blood Culture adequate volume   Culture   Final    NO GROWTH 6 DAYS Performed at [facility], [address] [place], [place] [address]    Report Status 09/07/2019 FINAL  Final  Urine culture     Status: None   Collection Time: 08/24/2019  4:45 PM   Specimen: In/Out Cath Urine  Result Value Ref Range Status   Specimen Description   Final    IN/OUT CATH URINE Performed at [facility], [address] [place], [place] [address]    Special Requests   Final    NONE Performed at [facility], [address] [place], [place] [address]    Culture   Final    NO GROWTH Performed at [facility], [address], [place], [place] [address]    Report Status 09/02/2019 FINAL  Final  MRSA PCR Screening     Status: None   Collection Time: 09/02/19  1:37 PM   Specimen: Nasopharyngeal  Result Value Ref Range Status   MRSA by PCR NEGATIVE NEGATIVE Final    Comment:        The GeneXpert MRSA Assay (FDA approved for NASAL specimens only), is one component of Jameika Kinn comprehensive MRSA colonization surveillance program. It is not intended to diagnose MRSA infection nor to guide or monitor treatment for MRSA infections. Performed at [facility], [address] [place], [place] [address]  Lab Basic Metabolic Panel: Recent Labs  Lab 09/03/19 0230 09/03/19 1558 09/04/19 0015 09/05/19 0513 September 08, 2019 0450  NA 137 139 140 139 145  K 2.6* 3.0* 3.0* 4.0 3.7  CL 108 107 109 107 111  CO2 17* 21* 21* 21* 21*  GLUCOSE 113* 116* 135* 202* 104*  BUN 65* 52* 46* 33* 35*  CREATININE 1.82* 1.45* 1.29* 1.08  1.40*  CALCIUM 8.1* 8.1* 8.0* 8.2* 8.6*  MG 2.2  --  2.1 1.8 1.7  PHOS 3.3  --  1.9* 2.5 2.5   Liver Function Tests: Recent Labs  Lab 09/03/19 0230 09/04/19 0015 09/05/19 0513 09-08-19 0450  AST 36 29 25 27   ALT 59* 47* 44 35  ALKPHOS 67 62 73 66  BILITOT 0.9 0.7 0.9 0.9  PROT 4.7* 4.3* 4.9* 4.6*  ALBUMIN 2.0* 1.8* 2.1* 1.9*   No results for input(s): LIPASE, AMYLASE in the last 168 hours. No results for input(s): AMMONIA in the last 168 hours. CBC: Recent Labs  Lab 09/03/19 0230 09/03/19 1558 09/04/19 0015 09/04/19 1650 09/05/19 0513 2019-09-08 0450  WBC 9.0  --  8.7  --  11.1* 9.2  NEUTROABS 8.3*  --  7.6  --  10.3* 8.5*  HGB 7.3* 7.4* 6.9* 9.0* 8.8* 10.7*  HCT 21.7* 21.8* 20.1* 26.6* 26.2* 33.7*  MCV 86.5  --  86.6  --  88.8 93.6  PLT 219  --  226  --  216 220   Cardiac Enzymes: No results for input(s): CKTOTAL, CKMB, CKMBINDEX, TROPONINI in the last 168 hours. Sepsis Labs: Recent Labs  Lab 09/03/19 0230 09/04/19 0015 09/05/19 0513 09/05/19 0600 09-08-2019 0450  PROCALCITON  --   --   --  0.17 3.06  WBC 9.0 8.7 11.1*  --  9.2    Procedures/Operations  See previous notes   Fayrene Helper 09/09/2019, 1:36 PM

## 2019-10-05 NOTE — Progress Notes (Signed)
@   2240 hrs pt spouse updated regarding pt change in condition.

## 2019-10-05 NOTE — Progress Notes (Signed)
Patient's wife and daugther were called and made aware of patient's death.  They were concerned about the patient's wallet.  No wallet was found in the room.  The patient's watch and eye glasses were found and sent with the body to the morgue.  We called Security at [employer] and Security at [facility] (patient was transferred from the ED) and they did not have any patient's belongings in the safe.  Per the admission history, patient only came with eye glasses and clothing.  Flo at [facility] was called and they have the patient's wallet and clothing.  We called Olegario Messier, the patient's wife, and made her aware of the above.  She was very appreciative of everything our staff had done for her husband and their family.  This Clinical research associate expressed our condolences at her loss and encouraged her to call if she had any additional questions.  Bernie Covey RN

## 2019-10-05 DEATH — deceased

## 2020-01-31 IMAGING — DX DG CHEST 1V PORT
1 series · 1 of 1 positions shown · non-contrast
Comparison: None.

CLINICAL DATA: Shortness of breath for 2 days, 5DWZ9-LZ positivity.

EXAM:
PORTABLE CHEST 1 VIEW

[chest ap]
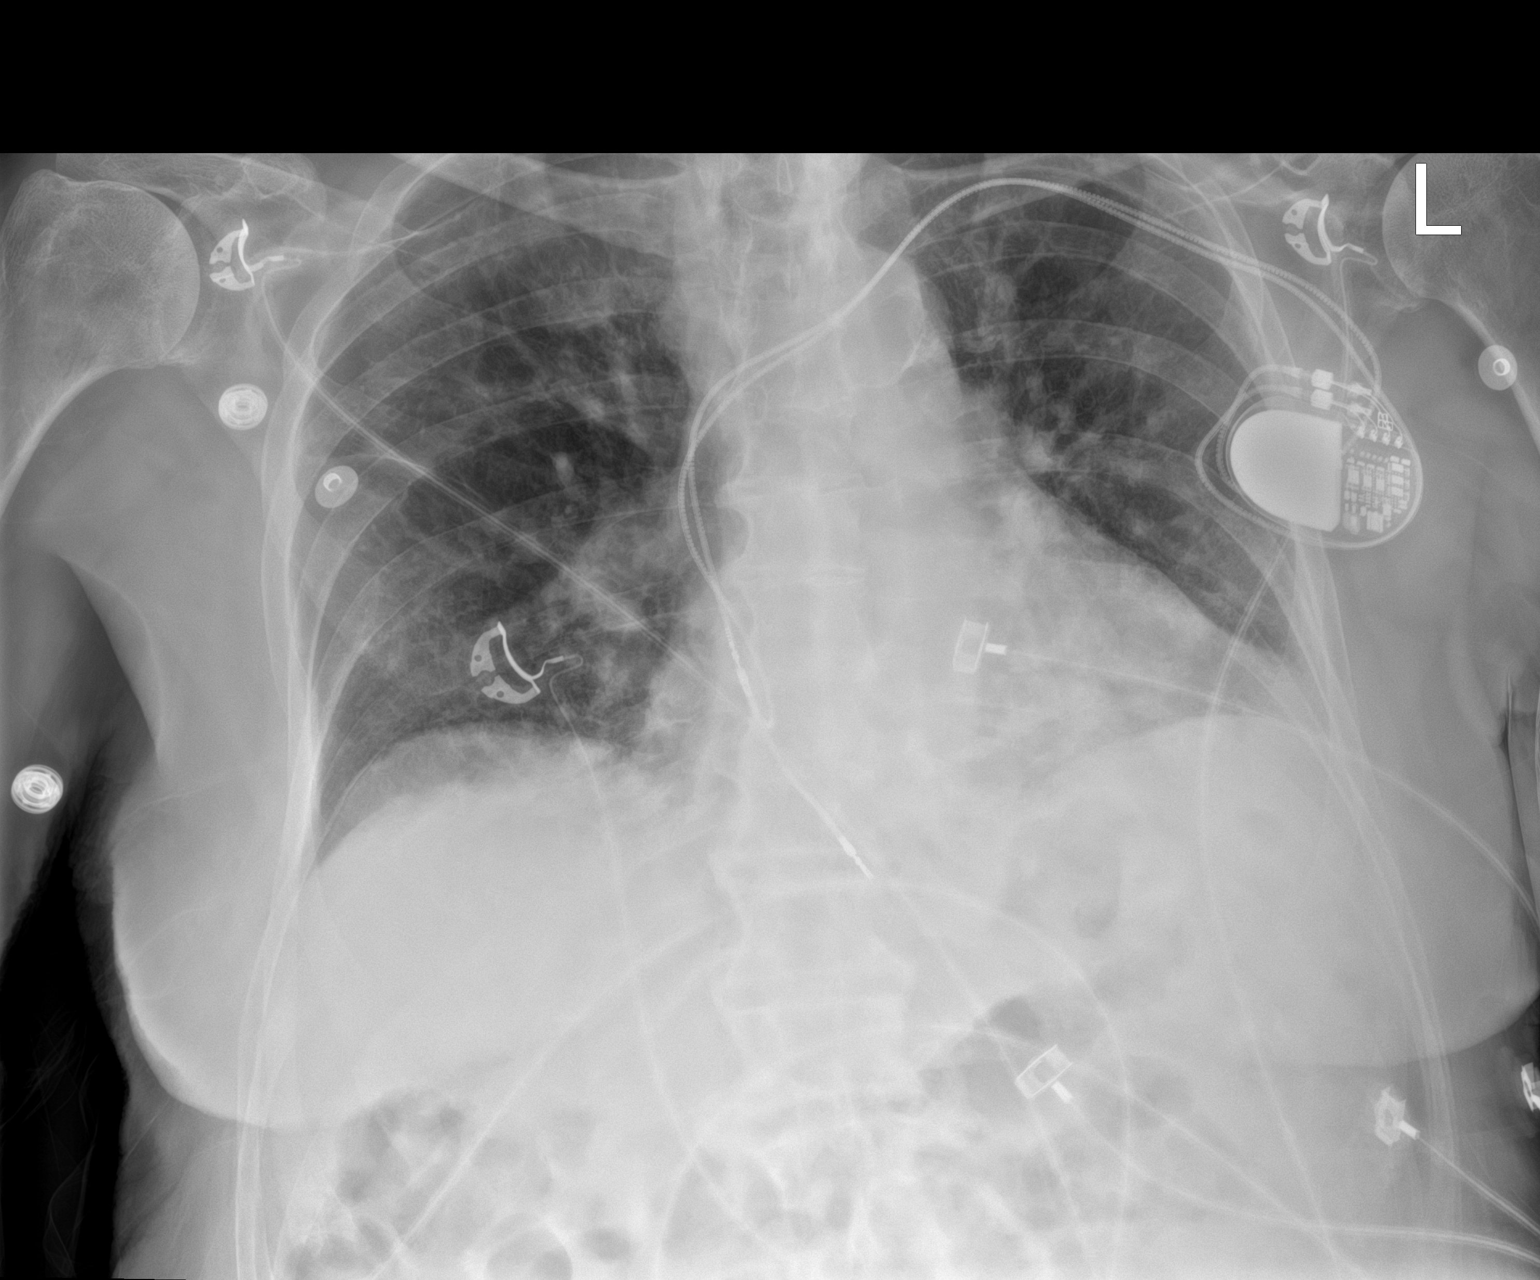

[1 of 1 positions shown; findings below may reference images not displayed]

FINDINGS: Cardiac shadow is enlarged. Pacing device is noted. Lungs are well
aerated bilaterally. Patchy parenchymal opacities are noted
bilaterally consistent with atypical pneumonia. No sizable effusion
is seen. No bony abnormality is noted.
IMPRESSION: Patchy opacities bilaterally consistent with atypical/viral
pneumonia. This is consistent with the patient's given clinical
history of 5DWZ9-LZ positivity.

## 2020-02-18 IMAGING — DX DG CHEST 1V PORT
1 series · 1 of 1 positions shown · non-contrast
Comparison: 09/01/2019

CLINICAL DATA: Hypoxia, history of LXRD8-SB positivity.

EXAM:
PORTABLE CHEST 1 VIEW

[chest]
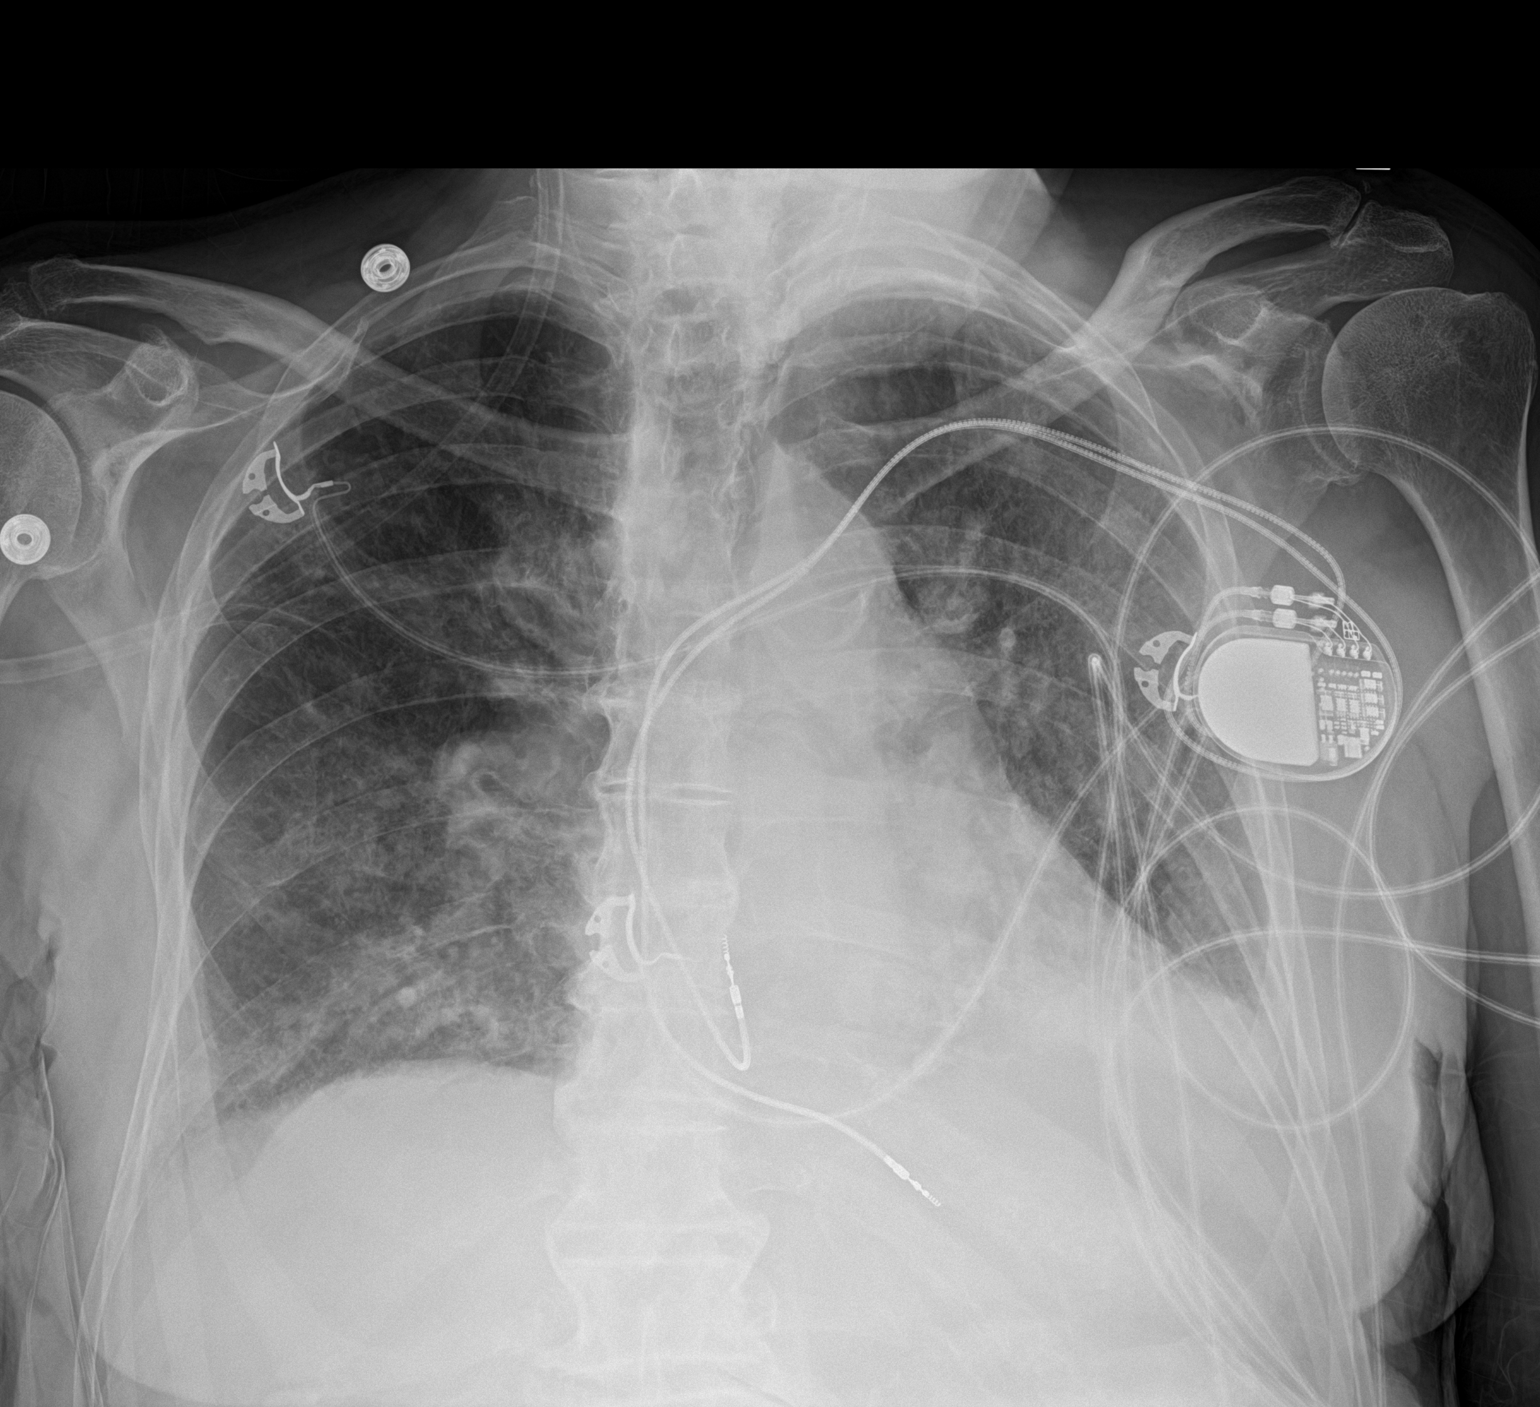

[1 of 1 positions shown; findings below may reference images not displayed]

FINDINGS: Cardiac shadow is stable. Pacing device is again seen. Patchy
airspace opacities are again identified slightly more marked when
compared with the prior exam consistent with the given clinical
history. No sizable effusion is seen. No bony abnormality is noted.
IMPRESSION: Slight increase in airspace opacities when compared with the prior
exam.

## 2020-02-20 IMAGING — DX DG CHEST 1V PORT
1 series · 1 of 1 positions shown · non-contrast
Comparison: 09/03/2019

CLINICAL DATA: Hypoxia.

EXAM:
PORTABLE CHEST 1 VIEW

[chest]
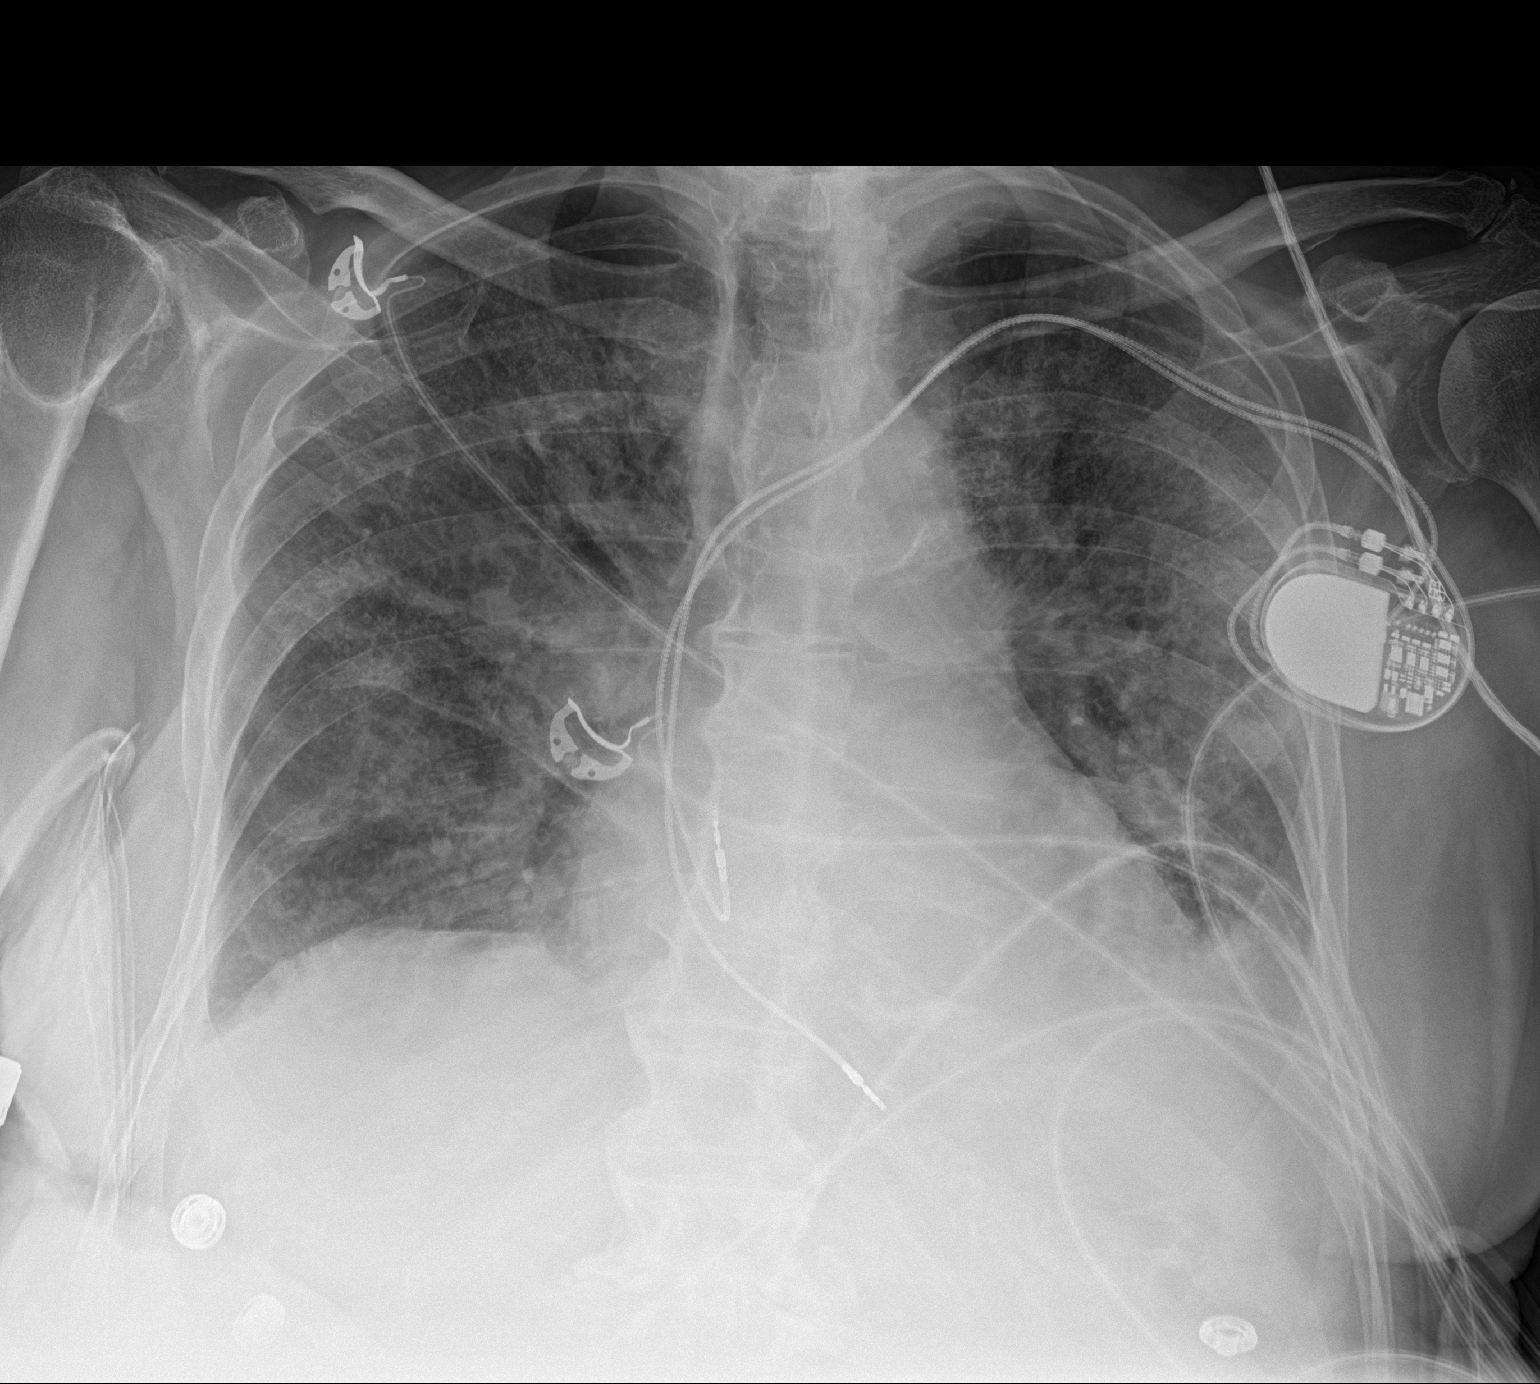

[1 of 1 positions shown; findings below may reference images not displayed]

FINDINGS: Left chest wall pacer device is noted with leads in the right atrial
appendage and right ventricle. Normal heart size. Small left pleural
effusion. Diffuse bilateral pulmonary opacities are unchanged.
IMPRESSION: 1. No change in bilateral pulmonary opacities and left pleural
effusion.

## 2020-07-08 ENCOUNTER — Telehealth: Payer: Medicare Other | Admitting: Internal Medicine
